# Patient Record
Sex: Male | Born: 1940 | Hispanic: No | Smoking: Never smoker
Health system: Southern US, Community
[De-identification: ages and names within clinical notes are randomized; demographics above are authoritative.]

## PROBLEM LIST (undated history)

## (undated) DIAGNOSIS — I714 Abdominal aortic aneurysm, without rupture, unspecified: Secondary | ICD-10-CM

## (undated) DIAGNOSIS — M51369 Other intervertebral disc degeneration, lumbar region without mention of lumbar back pain or lower extremity pain: Secondary | ICD-10-CM

## (undated) DIAGNOSIS — Z8719 Personal history of other diseases of the digestive system: Secondary | ICD-10-CM

## (undated) DIAGNOSIS — E119 Type 2 diabetes mellitus without complications: Secondary | ICD-10-CM

## (undated) DIAGNOSIS — G709 Myoneural disorder, unspecified: Secondary | ICD-10-CM

## (undated) DIAGNOSIS — N189 Chronic kidney disease, unspecified: Secondary | ICD-10-CM

## (undated) DIAGNOSIS — M5136 Other intervertebral disc degeneration, lumbar region: Secondary | ICD-10-CM

## (undated) DIAGNOSIS — I10 Essential (primary) hypertension: Secondary | ICD-10-CM

## (undated) HISTORY — DX: Abdominal aortic aneurysm, without rupture, unspecified: I71.40

## (undated) HISTORY — PX: SPINE SURGERY: SHX786

## (undated) HISTORY — DX: Other intervertebral disc degeneration, lumbar region without mention of lumbar back pain or lower extremity pain: M51.369

## (undated) HISTORY — DX: Type 2 diabetes mellitus without complications: E11.9

## (undated) HISTORY — DX: Other intervertebral disc degeneration, lumbar region: M51.36

## (undated) HISTORY — DX: Essential (primary) hypertension: I10

## (undated) HISTORY — DX: Abdominal aortic aneurysm, without rupture: I71.4

---

## 1998-11-30 ENCOUNTER — Ambulatory Visit (HOSPITAL_BASED_OUTPATIENT_CLINIC_OR_DEPARTMENT_OTHER): Admission: RE | Admit: 1998-11-30 | Discharge: 1998-11-30 | Payer: Self-pay | Admitting: Orthopedic Surgery

## 2014-05-09 ENCOUNTER — Telehealth: Payer: Self-pay | Admitting: Vascular Surgery

## 2014-05-09 NOTE — Telephone Encounter (Signed)
Received a referral request from Dr. Ann Held, [place] for 5.4 AAA.   I spoke with Mr. Andrew Estrada to arrange an appointment.   He refused to scheduled an appointment as he absolutely refuses to drive in [place] unless its an emergency.    I explained that he needs an appointment for his aneurysm, which he thought he was being referred for a hernia.    I offered an appointment for 12/22 with Dr. Donnetta Hutching (requested by the referring phys) patient also declined stating that was too close to Christmas and it just too busy that time of year.   I called Dr. Bary Leriche office and left a detailed voicemail for the triage nurse relaying the message stated above.

## 2014-05-11 ENCOUNTER — Encounter: Payer: Self-pay | Admitting: Vascular Surgery

## 2014-05-12 ENCOUNTER — Telehealth: Payer: Self-pay | Admitting: Vascular Surgery

## 2014-05-12 ENCOUNTER — Ambulatory Visit (INDEPENDENT_AMBULATORY_CARE_PROVIDER_SITE_OTHER): Payer: 59 | Admitting: Vascular Surgery

## 2014-05-12 ENCOUNTER — Encounter: Payer: Self-pay | Admitting: Vascular Surgery

## 2014-05-12 VITALS — BP 143/87 | HR 92 | Resp 16 | Ht 70.0 in | Wt 215.0 lb

## 2014-05-12 DIAGNOSIS — I714 Abdominal aortic aneurysm, without rupture, unspecified: Secondary | ICD-10-CM

## 2014-05-12 NOTE — Telephone Encounter (Signed)
Gave patient's wife the following information:  [facility] [phone] 05/18/14  3:15pm for [facility] 05/20/14 7:30 am Labs/CTA No npo 2 hrs. [phone] Return to see Dr. Donnetta Hutching on 06/14/14 per AVS. Wife verbalized understanding.

## 2014-05-12 NOTE — Progress Notes (Signed)
Patient name: Andrew Estrada MRN: [id number] DOB: 03/27/1941 Sex: male   Referred by: Nicki Reaper  Reason for referral:  Chief Complaint  Patient presents with  . New Evaluation    AAA referred by Dr Nicki Reaper    HISTORY OF PRESENT ILLNESS: Patient is a very pleasant 73 year old gentleman who had an incidental finding of 5.5 cm infrarenal abdominal aortic aneurysm on recent ultrasound. He had been having nonspecific abdominal discomfort and underwent ultrasound for further evaluation of this. Does have a long history of hiatal hernia. Also has a long history of severe venous disease. Did have an incidental finding of infrarenal aneurysm is here today for discussion of this. No symptoms related to his aneurysm. He denies any cardiac difficulties. Does have well-controlled non-insulin-dependent diabetes. He never smoked.  Past Medical History  Diagnosis Date  . Hypertension   . DDD (degenerative disc disease), lumbar   . Diabetes mellitus without complication     Diet controlled, no medications    Past Surgical History  Procedure Laterality Date  . Spine surgery      Laminectomy and failed disk syndrome     History   Social History  . Marital Status: Married    Spouse Name: N/A    Number of Children: N/A  . Years of Education: N/A   Occupational History  . Not on file.   Social History Main Topics  . Smoking status: Never Smoker   . Smokeless tobacco: Never Used  . Alcohol Use: No  . Drug Use: No  . Sexual Activity: Not on file   Other Topics Concern  . Not on file   Social History Narrative    Family History  Problem Relation Age of Onset  . Heart disease Mother   . Cancer Father     Allergies as of 05/12/2014 - Review Complete 05/12/2014  Allergen Reaction Noted  . Celebrex [celecoxib]  05/11/2014  . Ciprofloxacin Diarrhea 05/11/2014  . Codeine  05/11/2014    Current Outpatient Prescriptions on File Prior to Visit  Medication Sig Dispense Refill  .  cholecalciferol (VITAMIN D) 1000 UNITS tablet Take 1,000 Units by mouth daily.    . fluvastatin (LESCOL) 20 MG capsule Take 20 mg by mouth every evening.  6  . losartan (COZAAR) 25 MG tablet Take 25 mg by mouth daily.  12   No current facility-administered medications on file prior to visit.     REVIEW OF SYSTEMS:  Positives indicated with an "X"  CARDIOVASCULAR:  [ ]  chest pain   [x ] chest pressure   [ ]  palpitations   [ ]  orthopnea   [ ]  dyspnea on exertion   [ ]  claudication   [ ]  rest pain   [ ]  DVT   [ ]  phlebitis PULMONARY:   [ ]  productive cough   [ ]  asthma   [ ]  wheezing NEUROLOGIC:   [ ]  weakness  [ x] paresthesias  [ ]  aphasia  [ ]  amaurosis  [ ]  dizziness HEMATOLOGIC:   [ ]  bleeding problems   [ ]  clotting disorders MUSCULOSKELETAL:  [ ]  joint pain   [ ]  joint swelling GASTROINTESTINAL: [ ]   blood in stool  [ ]   hematemesis GENITOURINARY:  [x ]  dysuria  [ ]   hematuria PSYCHIATRIC:  [ ]  history of major depression INTEGUMENTARY:  [ ]  rashes  [ ]  ulcers CONSTITUTIONAL:  [ ]  fever   [ ]  chills  PHYSICAL EXAMINATION:  General: The patient is  a well-nourished male, in no acute distress. Vital signs are BP 143/87 mmHg  Pulse 92  Resp 16  Ht 5\' 10"  (1.778 m)  Wt 215 lb (97.523 kg)  BMI 30.85 kg/m2 Pulmonary: There is a good air exchange bilaterally without wheezing or rales. Abdomen: Soft and non-tender with normal pitch bowel sounds. Moderate obesity with no aneurysm palpable Musculoskeletal: There are no major deformities.  There is no significant extremity pain. Neurologic: No focal weakness or paresthesias are detected, Skin: There are no ulcer or rashes noted. Psychiatric: The patient has normal affect. Cardiovascular: There is a regular rate and rhythm without significant murmur appreciated. Pulse status 2+ radial 2+ femoral 2+ popliteal and 2+ dorsalis pedis pulses bilaterally Carotid arteries without bruits bilaterally    Impression and Plan:  I reviewed  his ultrasound with the patient. Had a very long discussion regarding his incidental finding of a 5.5 cm aneurysm. I explained the natural history and the risk of rupture and the very low chance for survival should rupture. I have recommended CT angiogram for further evaluation. I discussed both open and stent graft repair options for aneurysm. Also explained major risk from his aneurysm repair is actually cardiac issues. Although he is asymptomatic we will coordinate preoperative cardiac clearance through Dr. Bary Leriche office. We will also coordinate CT scan had [facility] for the patient's convenience. I will see him back in the office for further discussion following CT scan. I did explain symptoms of leaking aneurysm. The patient he knows to report ability should this occur. He understands this would be extremely unusual short-term.    Eliakim Tendler [facility] Office: [phone]

## 2014-05-20 ENCOUNTER — Telehealth: Payer: Self-pay

## 2014-05-20 NOTE — Telephone Encounter (Signed)
Reviewd pt's CTA Abd/ Pelvis with Dr. Bridgett Larsson , MD in office today.  Noted pt's AAA measuring 6 cm; and noted the finding of left kidney lesion.  Stated the f/u with Dr. Donnetta Hutching for further discussion of tx of AAA on 06/14/14 is okay.  Recommended to make PCP aware of the lesion on left kidney.  Called [facility]; spoke with nurse, Drue Dun.  Informed nurse that pt. had a CTA Abd/ Pelvis recently to further evaluate AAA, and it was noted that there is "a 1.1 cm. lesion on the anterior cortex (L) kidney, compatible with renal cell carcinoma."  Will fax report to Dr. Nicki Reaper @ [phone].

## 2014-06-07 ENCOUNTER — Encounter: Payer: Self-pay | Admitting: Vascular Surgery

## 2014-06-14 ENCOUNTER — Encounter: Payer: Self-pay | Admitting: Vascular Surgery

## 2014-06-14 ENCOUNTER — Ambulatory Visit (INDEPENDENT_AMBULATORY_CARE_PROVIDER_SITE_OTHER): Payer: 59 | Admitting: Vascular Surgery

## 2014-06-14 VITALS — BP 147/89 | HR 93 | Resp 16 | Ht 70.5 in | Wt 217.0 lb

## 2014-06-14 DIAGNOSIS — I714 Abdominal aortic aneurysm, without rupture, unspecified: Secondary | ICD-10-CM

## 2014-06-14 NOTE — Progress Notes (Signed)
    Established Abdominal Aortic Aneurysm  History of Present Illness  The patient is a 74 y.o. (1940/11/20) male who presents with chief complaint: follow up for AAA.  Previous ultrasound study demonstrated an AAA, measuring 5.5 cm.  The patient does not have back or abdominal pain.  The patient is not a smoker. He was last seen on 05/12/14 where he was recommended to have a CTA for further evaluation. He also has been evaluated by his cardiologist Dr. Bettina Gavia in [facility] for pre-operative risk stratification and optimization.   Physical Examination  Filed Vitals:   06/14/14 1414  BP: 147/89  Pulse: 93  Resp: 16  Height: 5' 10.5" (1.791 m)  Weight: 217 lb (98.431 kg)   Body mass index is 30.69 kg/(m^2).  General: A&O x 3, WD overweight male in NAD  Pulmonary: Non labored breathing  Vascular: 2+ dorsalis pedis pulses bilaterally   Medical Decision Making  The patient is a 74 y.o. male who presents with: asymptomatic AAA measuring 6 cm on CTA. Assured the patient that there are discrepancies between measurements on ultrasound versus CT and it is highly unlikely that his aneurysm grew 0.5 cm in the past few weeks. Upon reviewing his CTA results, the patient looks to be a good candidate for endovascular stent graft repair. Stent graft repair and open repair were discussed at length with the patient. The risks and benefits were discussed and the patient has decided to proceed with endovascular repair of his AAA. This will be scheduled at the patient's convenience. He has been cleared for surgery by his cardiologist Dr. Bettina Gavia, but we do not have access to the records at this time.    Virgina Jock, PA-C [facility] Office: [phone] Pager: [phone]  06/14/2014, 3:01 PM   This patient was seen in conjunction with Dr. Donnetta Hutching  I have examined the patient, reviewed and agree with above. Here today with his wife for continued discussion of his  infrarenal abdominal aortic aneurysm. He did have a CT scan had [facility] and I have the images for review. This shows maximal diameter 6 cm. He has a nice long infrarenal neck below the renal arteries prior to aneurysm formation and has acceptable iliac vessels for distal ceiling. Also has normal caliber femoral arteries for percutaneous access. Discussed option for open and stent graft repair and have recommended stent graft repair. Did explain lifelong follow-up required following stent graft repair. Splane the expected one day hospitalization. Patient understands and wished to proceed as soon as possible we will schedule this at his earliest convenience. He has had cardiac clearance.  EARLY, TODD, MD 06/14/2014 3:34 PM

## 2014-06-20 ENCOUNTER — Other Ambulatory Visit: Payer: Self-pay

## 2014-06-21 ENCOUNTER — Encounter: Payer: Self-pay | Admitting: Vascular Surgery

## 2014-06-24 ENCOUNTER — Encounter (HOSPITAL_COMMUNITY): Payer: Self-pay | Admitting: Pharmacy Technician

## 2014-06-27 ENCOUNTER — Encounter (HOSPITAL_COMMUNITY)
Admission: RE | Admit: 2014-06-27 | Discharge: 2014-06-27 | Disposition: A | Discharge status: Home / Self Care | Payer: Medicare Other | Source: Ambulatory Visit | Attending: Vascular Surgery | Admitting: Vascular Surgery

## 2014-06-27 ENCOUNTER — Other Ambulatory Visit: Payer: Self-pay

## 2014-06-27 ENCOUNTER — Encounter (HOSPITAL_COMMUNITY): Payer: Self-pay

## 2014-06-27 DIAGNOSIS — Z01812 Encounter for preprocedural laboratory examination: Secondary | ICD-10-CM | POA: Diagnosis not present

## 2014-06-27 DIAGNOSIS — Z01810 Encounter for preprocedural cardiovascular examination: Secondary | ICD-10-CM | POA: Insufficient documentation

## 2014-06-27 DIAGNOSIS — Z01818 Encounter for other preprocedural examination: Secondary | ICD-10-CM | POA: Insufficient documentation

## 2014-06-27 DIAGNOSIS — I714 Abdominal aortic aneurysm, without rupture: Secondary | ICD-10-CM | POA: Diagnosis not present

## 2014-06-27 HISTORY — DX: Myoneural disorder, unspecified: G70.9

## 2014-06-27 HISTORY — DX: Personal history of other diseases of the digestive system: Z87.19

## 2014-06-27 HISTORY — DX: Chronic kidney disease, unspecified: N18.9

## 2014-06-27 LAB — COMPREHENSIVE METABOLIC PANEL
ALT: 19 U/L (ref 0–53)
AST: 24 U/L (ref 0–37)
Albumin: 3.9 g/dL (ref 3.5–5.2)
Alkaline Phosphatase: 59 U/L (ref 39–117)
Anion gap: 13 (ref 5–15)
BUN: 12 mg/dL (ref 6–23)
CO2: 23 mmol/L (ref 19–32)
Calcium: 9.1 mg/dL (ref 8.4–10.5)
Chloride: 103 mEq/L (ref 96–112)
Creatinine, Ser: 0.99 mg/dL (ref 0.50–1.35)
GFR calc non Af Amer: 78 mL/min — ABNORMAL LOW (ref >90)
Glucose, Bld: 121 mg/dL — ABNORMAL HIGH (ref 70–99)
Potassium: 4.1 mmol/L (ref 3.5–5.1)
SODIUM: 139 mmol/L (ref 135–145)
Total Bilirubin: 0.4 mg/dL (ref 0.3–1.2)
Total Protein: 7.2 g/dL (ref 6.0–8.3)

## 2014-06-27 LAB — CBC
HCT: 41.3 % (ref 39.0–52.0)
HEMOGLOBIN: 14.1 g/dL (ref 13.0–17.0)
MCH: 31.4 pg (ref 26.0–34.0)
MCHC: 34.1 g/dL (ref 30.0–36.0)
MCV: 92 fL (ref 78.0–100.0)
PLATELETS: 197 10*3/uL (ref 150–400)
RBC: 4.49 MIL/uL (ref 4.22–5.81)
RDW: 13 % (ref 11.5–15.5)
WBC: 7.6 10*3/uL (ref 4.0–10.5)

## 2014-06-27 LAB — URINE MICROSCOPIC-ADD ON

## 2014-06-27 LAB — SURGICAL PCR SCREEN
MRSA, PCR: NEGATIVE
STAPHYLOCOCCUS AUREUS: POSITIVE — AB

## 2014-06-27 LAB — URINALYSIS, ROUTINE W REFLEX MICROSCOPIC
Bilirubin Urine: NEGATIVE
Glucose, UA: NEGATIVE mg/dL
KETONES UR: NEGATIVE mg/dL
Leukocytes, UA: NEGATIVE
Nitrite: NEGATIVE
PROTEIN: NEGATIVE mg/dL
Specific Gravity, Urine: 1.028 (ref 1.005–1.030)
UROBILINOGEN UA: 0.2 mg/dL (ref 0.0–1.0)
pH: 5 (ref 5.0–8.0)

## 2014-06-27 LAB — BLOOD GAS, ARTERIAL
ACID-BASE EXCESS: 2.1 mmol/L — AB (ref 0.0–2.0)
Bicarbonate: 26.4 mEq/L — ABNORMAL HIGH (ref 20.0–24.0)
Drawn by: 206361
FIO2: 0.21 %
O2 Saturation: 95.5 %
PATIENT TEMPERATURE: 98.6
PCO2 ART: 42.6 mmHg (ref 35.0–45.0)
TCO2: 27.7 mmol/L (ref 0–100)
pH, Arterial: 7.408 (ref 7.350–7.450)
pO2, Arterial: 74.9 mmHg — ABNORMAL LOW (ref 80.0–100.0)

## 2014-06-27 LAB — TYPE AND SCREEN
ABO/RH(D): O POS
Antibody Screen: NEGATIVE

## 2014-06-27 LAB — PROTIME-INR
INR: 0.95 (ref 0.00–1.49)
PROTHROMBIN TIME: 12.8 s (ref 11.6–15.2)

## 2014-06-27 LAB — APTT: aPTT: 32 seconds (ref 24–37)

## 2014-06-27 LAB — ABO/RH: ABO/RH(D): O POS

## 2014-06-27 NOTE — Pre-Procedure Instructions (Signed)
[facility]  06/27/2014   Your procedure is scheduled on: January 25th, Monday   Report to [facility] Admitting at 5:30 AM.   Call this number if you have problems the morning of surgery: [phone]   Remember:   Do not eat food or drink liquids after midnight Sunday.   Take these medicines the morning of surgery with A SIP OF WATER: None   Do not wear jewelry, no rings or watches.  Do not wear lotions or colognes.  You may NOT wear deodorant the day of surgery.   Men may shave face and neck.   Do not bring valuables to the hospital.  [facility] is not responsible for any belongings or valuables.               Contacts, dentures or bridgework may not be worn into surgery.  Leave suitcase in the car. After surgery it may be brought to your room.  For patients admitted to the hospital, discharge time is determined by your treatment team.    Name and phone number of your driver:    Special Instructions: "Preparing for Surgery" instruction sheet.   Please read over the following fact sheets that you were given: Pain Booklet, Coughing and Deep Breathing, Blood Transfusion Information, MRSA Information and Surgical Site Infection Prevention

## 2014-06-27 NOTE — Progress Notes (Addendum)
Patient has gone to see a Dr. Chrissie Noa [facility] in [place] ([place] for series of cardiac tests, which were done at [facility].  I have requested those tests today  [phone] He also has a 'spot' they just discovered from the CT Scan on his left kidney.  He states that he is unable to lay flat d/t the discomfort in the left flank.  His PCP is Dr. Ann Held @ [place].   DA

## 2014-06-27 NOTE — Progress Notes (Signed)
   06/27/14 1019  OBSTRUCTIVE SLEEP APNEA  Have you ever been diagnosed with sleep apnea through a sleep study? No  Do you snore loudly (loud enough to be heard through closed doors)?  1  Do you often feel tired, fatigued, or sleepy during the daytime? 1  Has anyone observed you stop breathing during your sleep? 0  Do you have, or are you being treated for high blood pressure? 1  BMI more than 35 kg/m2? 1  Age over 76 years old? 1  Neck circumference greater than 40 cm/16 inches? 1  Gender: 1  Obstructive Sleep Apnea Score 7  Score 4 or greater  Results sent to PCP

## 2014-06-28 NOTE — Progress Notes (Signed)
Anesthesia Chart Review:  Andrew Estrada is 74 year old male scheduled for abdominal aortic endovascular stent graft on 07/04/2014 with Dr. Donnetta Hutching.   PMH includes: HTN, DM, AAA, CKD. BMI 29.7  Preoperative labs reviewed.    EKG: NSR. Possible Left atrial enlargement. RBBB. Cannot rule out Inferior infarct , age undetermined. Anterior infarct , age undetermined. Compared to previous 05/18/2014, Q wave in lead III, otherwise appears stable.   Nuclear stress test 05/23/2014: 1. Functional capacity is not addressed 2. Normal resting LV size and function, with end-systolic volume of 63AG and EF of 68% 3. No perfusion defects, rest or vasodilator stress  Andrew Estrada has cardiac clearance from Dr. Danie Binder on chart at low/acceptable risk for AAA resection or endovascular repair.   If no changes, I anticipate Andrew Estrada can proceed with surgery as scheduled.   Willeen Cass, FNP-BC [facility]/Anesthesiology Phone: [phone] 06/28/2014 4:38 PM

## 2014-07-03 MED ORDER — DEXTROSE 5 % IV SOLN
1.5000 g | INTRAVENOUS | Status: AC
  Administered 2014-07-04: 1.5 g via INTRAVENOUS
  Filled 2014-07-03: qty 1.5

## 2014-07-04 ENCOUNTER — Inpatient Hospital Stay (HOSPITAL_COMMUNITY): Payer: Medicare Other | Admitting: Emergency Medicine

## 2014-07-04 ENCOUNTER — Encounter (HOSPITAL_COMMUNITY): Payer: Self-pay | Admitting: *Deleted

## 2014-07-04 ENCOUNTER — Encounter (HOSPITAL_COMMUNITY)
Admission: RE | Disposition: A | Discharge status: Home / Self Care | Payer: Self-pay | Source: Ambulatory Visit | Attending: Vascular Surgery

## 2014-07-04 ENCOUNTER — Inpatient Hospital Stay (HOSPITAL_COMMUNITY)
Admission: RE | Admit: 2014-07-04 | Discharge: 2014-07-05 | DRG: 269 | Disposition: A | Discharge status: Home / Self Care | Payer: Medicare Other | Source: Ambulatory Visit | Attending: Vascular Surgery | Admitting: Vascular Surgery

## 2014-07-04 ENCOUNTER — Inpatient Hospital Stay (HOSPITAL_COMMUNITY): Payer: Medicare Other | Admitting: Anesthesiology

## 2014-07-04 DIAGNOSIS — I714 Abdominal aortic aneurysm, without rupture, unspecified: Secondary | ICD-10-CM | POA: Diagnosis present

## 2014-07-04 DIAGNOSIS — Z6830 Body mass index (BMI) 30.0-30.9, adult: Secondary | ICD-10-CM | POA: Diagnosis not present

## 2014-07-04 DIAGNOSIS — I129 Hypertensive chronic kidney disease with stage 1 through stage 4 chronic kidney disease, or unspecified chronic kidney disease: Secondary | ICD-10-CM | POA: Diagnosis present

## 2014-07-04 DIAGNOSIS — E119 Type 2 diabetes mellitus without complications: Secondary | ICD-10-CM | POA: Diagnosis present

## 2014-07-04 DIAGNOSIS — E663 Overweight: Secondary | ICD-10-CM | POA: Diagnosis present

## 2014-07-04 DIAGNOSIS — N189 Chronic kidney disease, unspecified: Secondary | ICD-10-CM | POA: Diagnosis present

## 2014-07-04 HISTORY — PX: ABDOMINAL AORTIC ENDOVASCULAR STENT GRAFT: SHX5707

## 2014-07-04 LAB — GLUCOSE, CAPILLARY
GLUCOSE-CAPILLARY: 116 mg/dL — AB (ref 70–99)
Glucose-Capillary: 119 mg/dL — ABNORMAL HIGH (ref 70–99)

## 2014-07-04 LAB — CBC
HEMATOCRIT: 39.8 % (ref 39.0–52.0)
Hemoglobin: 13.8 g/dL (ref 13.0–17.0)
MCH: 32 pg (ref 26.0–34.0)
MCHC: 34.7 g/dL (ref 30.0–36.0)
MCV: 92.3 fL (ref 78.0–100.0)
Platelets: 188 10*3/uL (ref 150–400)
RBC: 4.31 MIL/uL (ref 4.22–5.81)
RDW: 12.8 % (ref 11.5–15.5)
WBC: 7.7 10*3/uL (ref 4.0–10.5)

## 2014-07-04 LAB — CREATININE, SERUM
Creatinine, Ser: 0.84 mg/dL (ref 0.50–1.35)
GFR, EST NON AFRICAN AMERICAN: 84 mL/min — AB (ref >90)

## 2014-07-04 SURGERY — INSERTION, ENDOVASCULAR STENT GRAFT, AORTA, ABDOMINAL
Anesthesia: General | Site: Abdomen

## 2014-07-04 MED ORDER — GLYCOPYRROLATE 0.2 MG/ML IJ SOLN
INTRAMUSCULAR | Status: AC
  Filled 2014-07-04: qty 2

## 2014-07-04 MED ORDER — LABETALOL HCL 5 MG/ML IV SOLN: 10.0000 mg | INTRAVENOUS | Status: DC | PRN

## 2014-07-04 MED ORDER — NEOSTIGMINE METHYLSULFATE 10 MG/10ML IV SOLN
INTRAVENOUS | Status: DC | PRN
  Administered 2014-07-04: 3 mg via INTRAVENOUS

## 2014-07-04 MED ORDER — LIDOCAINE HCL (CARDIAC) 20 MG/ML IV SOLN
INTRAVENOUS | Status: AC
  Filled 2014-07-04: qty 5

## 2014-07-04 MED ORDER — SODIUM CHLORIDE 0.9 % IV SOLN: 500.0000 mL | Freq: Once | INTRAVENOUS | Status: AC | PRN

## 2014-07-04 MED ORDER — METOPROLOL TARTRATE 1 MG/ML IV SOLN: 2.0000 mg | INTRAVENOUS | Status: DC | PRN

## 2014-07-04 MED ORDER — DEXTROSE 5 % IV SOLN
1.5000 g | Freq: Two times a day (BID) | INTRAVENOUS | Status: AC
  Administered 2014-07-04 – 2014-07-05 (×2): 1.5 g via INTRAVENOUS
  Filled 2014-07-04 (×2): qty 1.5

## 2014-07-04 MED ORDER — HEPARIN SODIUM (PORCINE) 1000 UNIT/ML IJ SOLN
INTRAMUSCULAR | Status: DC | PRN
  Administered 2014-07-04: 5000 [IU] via INTRAVENOUS

## 2014-07-04 MED ORDER — LIDOCAINE HCL (CARDIAC) 20 MG/ML IV SOLN
INTRAVENOUS | Status: DC | PRN
  Administered 2014-07-04: 80 mg via INTRAVENOUS

## 2014-07-04 MED ORDER — GUAIFENESIN-DM 100-10 MG/5ML PO SYRP: 15.0000 mL | ORAL_SOLUTION | ORAL | Status: DC | PRN

## 2014-07-04 MED ORDER — POTASSIUM CHLORIDE CRYS ER 20 MEQ PO TBCR: 20.0000 meq | EXTENDED_RELEASE_TABLET | Freq: Once | ORAL | Status: AC | PRN

## 2014-07-04 MED ORDER — ACETAMINOPHEN 500 MG PO TABS: 500.0000 mg | ORAL_TABLET | Freq: Four times a day (QID) | ORAL | Status: DC | PRN

## 2014-07-04 MED ORDER — PROMETHAZINE HCL 25 MG/ML IJ SOLN: 6.2500 mg | INTRAMUSCULAR | Status: DC | PRN

## 2014-07-04 MED ORDER — 0.9 % SODIUM CHLORIDE (POUR BTL) OPTIME
TOPICAL | Status: DC | PRN
  Administered 2014-07-04: 1000 mL

## 2014-07-04 MED ORDER — ENOXAPARIN SODIUM 40 MG/0.4ML ~~LOC~~ SOLN
40.0000 mg | SUBCUTANEOUS | Status: DC
  Administered 2014-07-04: 40 mg via SUBCUTANEOUS
  Filled 2014-07-04 (×2): qty 0.4

## 2014-07-04 MED ORDER — VITAMIN D3 25 MCG (1000 UNIT) PO TABS
3000.0000 [IU] | ORAL_TABLET | Freq: Every day | ORAL | Status: DC
  Administered 2014-07-04 – 2014-07-05 (×2): 3000 [IU] via ORAL
  Filled 2014-07-04 (×2): qty 3

## 2014-07-04 MED ORDER — TRAMADOL HCL 50 MG PO TABS: 50.0000 mg | ORAL_TABLET | Freq: Four times a day (QID) | ORAL | Status: DC | PRN

## 2014-07-04 MED ORDER — PHENYLEPHRINE HCL 10 MG/ML IJ SOLN
10.0000 mg | INTRAMUSCULAR | Status: DC | PRN
  Administered 2014-07-04: 40 ug/min via INTRAVENOUS

## 2014-07-04 MED ORDER — PROTAMINE SULFATE 10 MG/ML IV SOLN
INTRAVENOUS | Status: AC
  Filled 2014-07-04: qty 5

## 2014-07-04 MED ORDER — PANTOPRAZOLE SODIUM 40 MG PO TBEC
40.0000 mg | DELAYED_RELEASE_TABLET | Freq: Every day | ORAL | Status: DC
  Administered 2014-07-04 – 2014-07-05 (×2): 40 mg via ORAL
  Filled 2014-07-04 (×2): qty 1

## 2014-07-04 MED ORDER — ONDANSETRON HCL 4 MG/2ML IJ SOLN
INTRAMUSCULAR | Status: DC | PRN
Start: 2014-07-04 — End: 2014-07-04
  Administered 2014-07-04: 4 mg via INTRAVENOUS

## 2014-07-04 MED ORDER — FENTANYL CITRATE 0.05 MG/ML IJ SOLN
INTRAMUSCULAR | Status: DC | PRN
Start: 2014-07-04 — End: 2014-07-04
  Administered 2014-07-04: 100 ug via INTRAVENOUS

## 2014-07-04 MED ORDER — ROCURONIUM BROMIDE 50 MG/5ML IV SOLN
INTRAVENOUS | Status: AC
  Filled 2014-07-04: qty 1

## 2014-07-04 MED ORDER — MIDAZOLAM HCL 2 MG/2ML IJ SOLN
INTRAMUSCULAR | Status: AC
  Filled 2014-07-04: qty 2

## 2014-07-04 MED ORDER — PROTAMINE SULFATE 10 MG/ML IV SOLN
INTRAVENOUS | Status: DC | PRN
  Administered 2014-07-04: 50 mg via INTRAVENOUS

## 2014-07-04 MED ORDER — LOSARTAN POTASSIUM 25 MG PO TABS
25.0000 mg | ORAL_TABLET | Freq: Every day | ORAL | Status: DC
  Administered 2014-07-04 – 2014-07-05 (×2): 25 mg via ORAL
  Filled 2014-07-04 (×2): qty 1

## 2014-07-04 MED ORDER — NEOSTIGMINE METHYLSULFATE 10 MG/10ML IV SOLN
INTRAVENOUS | Status: AC
  Filled 2014-07-04: qty 1

## 2014-07-04 MED ORDER — ONDANSETRON HCL 4 MG/2ML IJ SOLN
INTRAMUSCULAR | Status: AC
  Filled 2014-07-04: qty 2

## 2014-07-04 MED ORDER — FENTANYL CITRATE 0.05 MG/ML IJ SOLN
INTRAMUSCULAR | Status: AC
  Filled 2014-07-04: qty 5

## 2014-07-04 MED ORDER — ROCURONIUM BROMIDE 100 MG/10ML IV SOLN
INTRAVENOUS | Status: DC | PRN
  Administered 2014-07-04: 50 mg via INTRAVENOUS

## 2014-07-04 MED ORDER — HYDROMORPHONE HCL 1 MG/ML IJ SOLN: 0.2500 mg | INTRAMUSCULAR | Status: DC | PRN

## 2014-07-04 MED ORDER — MORPHINE SULFATE 2 MG/ML IJ SOLN: 2.0000 mg | INTRAMUSCULAR | Status: DC | PRN

## 2014-07-04 MED ORDER — HYDRALAZINE HCL 20 MG/ML IJ SOLN: 5.0000 mg | INTRAMUSCULAR | Status: DC | PRN

## 2014-07-04 MED ORDER — SODIUM CHLORIDE 0.9 % IR SOLN
Status: DC | PRN
  Administered 2014-07-04: 500 mL

## 2014-07-04 MED ORDER — LACTATED RINGERS IV SOLN
INTRAVENOUS | Status: DC | PRN
  Administered 2014-07-04: 06:00:00 via INTRAVENOUS

## 2014-07-04 MED ORDER — KCL IN DEXTROSE-NACL 20-5-0.45 MEQ/L-%-% IV SOLN
INTRAVENOUS | Status: AC
  Filled 2014-07-04: qty 1000

## 2014-07-04 MED ORDER — IODIXANOL 320 MG/ML IV SOLN
INTRAVENOUS | Status: DC | PRN
  Administered 2014-07-04: 60.4 mL via INTRAVENOUS

## 2014-07-04 MED ORDER — DOCUSATE SODIUM 100 MG PO CAPS
100.0000 mg | ORAL_CAPSULE | Freq: Every day | ORAL | Status: DC
  Administered 2014-07-05: 100 mg via ORAL
  Filled 2014-07-04: qty 1

## 2014-07-04 MED ORDER — CHLORHEXIDINE GLUCONATE 4 % EX LIQD: 60.0000 mL | Freq: Once | CUTANEOUS | Status: DC

## 2014-07-04 MED ORDER — PROPOFOL 10 MG/ML IV BOLUS
INTRAVENOUS | Status: AC
Start: 2014-07-04 — End: 2014-07-04
  Filled 2014-07-04: qty 20

## 2014-07-04 MED ORDER — ONDANSETRON HCL 4 MG/2ML IJ SOLN: 4.0000 mg | Freq: Four times a day (QID) | INTRAMUSCULAR | Status: DC | PRN

## 2014-07-04 MED ORDER — MIDAZOLAM HCL 5 MG/5ML IJ SOLN
INTRAMUSCULAR | Status: DC | PRN
  Administered 2014-07-04: 1 mg via INTRAVENOUS

## 2014-07-04 MED ORDER — PHENOL 1.4 % MT LIQD: 1.0000 | OROMUCOSAL | Status: DC | PRN

## 2014-07-04 MED ORDER — SODIUM CHLORIDE 0.9 % IV SOLN: INTRAVENOUS | Status: DC

## 2014-07-04 MED ORDER — PROPOFOL 10 MG/ML IV BOLUS
INTRAVENOUS | Status: DC | PRN
  Administered 2014-07-04: 120 mg via INTRAVENOUS

## 2014-07-04 MED ORDER — HEPARIN SODIUM (PORCINE) 1000 UNIT/ML IJ SOLN
INTRAMUSCULAR | Status: AC
  Filled 2014-07-04: qty 2

## 2014-07-04 MED ORDER — GLYCOPYRROLATE 0.2 MG/ML IJ SOLN
INTRAMUSCULAR | Status: DC | PRN
  Administered 2014-07-04: 0.4 mg via INTRAVENOUS

## 2014-07-04 MED ORDER — DEXAMETHASONE SODIUM PHOSPHATE 4 MG/ML IJ SOLN
INTRAMUSCULAR | Status: AC
  Filled 2014-07-04: qty 1

## 2014-07-04 MED ORDER — ALUM & MAG HYDROXIDE-SIMETH 200-200-20 MG/5ML PO SUSP: 15.0000 mL | ORAL | Status: DC | PRN

## 2014-07-04 MED ORDER — LACTATED RINGERS IV SOLN
INTRAVENOUS | Status: DC
  Administered 2014-07-04: 07:00:00 via INTRAVENOUS

## 2014-07-04 MED ORDER — ACETAMINOPHEN 325 MG PO TABS: 325.0000 mg | ORAL_TABLET | ORAL | Status: DC | PRN

## 2014-07-04 MED ORDER — KCL IN DEXTROSE-NACL 20-5-0.45 MEQ/L-%-% IV SOLN
INTRAVENOUS | Status: DC
  Administered 2014-07-04: 15:00:00 via INTRAVENOUS
  Filled 2014-07-04 (×3): qty 1000

## 2014-07-04 MED ORDER — ACETAMINOPHEN 650 MG RE SUPP: 325.0000 mg | RECTAL | Status: DC | PRN

## 2014-07-04 SURGICAL SUPPLY — 76 items
APL SKNCLS STERI-STRIP NONHPOA (GAUZE/BANDAGES/DRESSINGS) ×1
BAG BANDED W/RUBBER/TAPE 36X54 (MISCELLANEOUS) ×3 IMPLANT
BAG EQP BAND 135X91 W/RBR TAPE (MISCELLANEOUS) ×1
BAG SNAP BAND KOVER 36X36 (MISCELLANEOUS) ×3 IMPLANT
BENZOIN TINCTURE PRP APPL 2/3 (GAUZE/BANDAGES/DRESSINGS) ×3 IMPLANT
CANISTER SUCTION 2500CC (MISCELLANEOUS) ×3 IMPLANT
CATH BEACON 5.038 65CM KMP-01 (CATHETERS) ×2 IMPLANT
CATH OMNI FLUSH .035X70CM (CATHETERS) ×2 IMPLANT
CLIP LIGATING EXTRA MED SLVR (CLIP) IMPLANT
CLIP LIGATING EXTRA SM BLUE (MISCELLANEOUS) IMPLANT
CLOSURE WOUND 1/2 X4 (GAUZE/BANDAGES/DRESSINGS) ×1
COVER DOME SNAP 22 D (MISCELLANEOUS) ×3 IMPLANT
COVER MAYO STAND STRL (DRAPES) ×3 IMPLANT
COVER PROBE W GEL 5X96 (DRAPES) ×3 IMPLANT
COVER SURGICAL LIGHT HANDLE (MISCELLANEOUS) ×3 IMPLANT
DEVICE CLOSURE PERCLS PRGLD 6F (VASCULAR PRODUCTS) IMPLANT
DRAPE TABLE COVER HEAVY DUTY (DRAPES) ×3 IMPLANT
DRSG TEGADERM 2-3/8X2-3/4 SM (GAUZE/BANDAGES/DRESSINGS) ×6 IMPLANT
DRSG TEGADERM 4X4.75 (GAUZE/BANDAGES/DRESSINGS) ×2 IMPLANT
DRYSEAL FLEXSHEATH 12FR 33CM (SHEATH) ×2
DRYSEAL FLEXSHEATH 18FR 33CM (SHEATH) ×2
ELECT CAUTERY BLADE 6.4 (BLADE) IMPLANT
ELECT REM PT RETURN 9FT ADLT (ELECTROSURGICAL) ×6
ELECTRODE REM PT RTRN 9FT ADLT (ELECTROSURGICAL) ×2 IMPLANT
EXCLUDER TNK LEG 31MX14X17 (Endovascular Graft) IMPLANT
EXCLUDER TRUNK LEG 31MX14X17 (Endovascular Graft) ×3 IMPLANT
GAUZE SPONGE 2X2 8PLY STRL LF (GAUZE/BANDAGES/DRESSINGS) ×1 IMPLANT
GLOVE BIO SURGEON STRL SZ 6.5 (GLOVE) ×1 IMPLANT
GLOVE BIO SURGEONS STRL SZ 6.5 (GLOVE) ×1
GLOVE BIOGEL PI IND STRL 6.5 (GLOVE) IMPLANT
GLOVE BIOGEL PI INDICATOR 6.5 (GLOVE) ×4
GLOVE SS BIOGEL STRL SZ 7.5 (GLOVE) ×1 IMPLANT
GLOVE SUPERSENSE BIOGEL SZ 7.5 (GLOVE) ×2
GOWN STRL REUS W/ TWL LRG LVL3 (GOWN DISPOSABLE) ×3 IMPLANT
GOWN STRL REUS W/TWL LRG LVL3 (GOWN DISPOSABLE) ×9
GRAFT BALLN CATH 65CM (STENTS) IMPLANT
GRAFT EXCLUDER LEG 12X12 (Endovascular Graft) ×2 IMPLANT
INSERT FOGARTY 61MM (MISCELLANEOUS) IMPLANT
INSERT FOGARTY SM (MISCELLANEOUS) IMPLANT
KIT BASIN OR (CUSTOM PROCEDURE TRAY) ×3 IMPLANT
KIT ROOM TURNOVER OR (KITS) ×3 IMPLANT
NDL PERC 18GX7CM (NEEDLE) ×1 IMPLANT
NEEDLE PERC 18GX7CM (NEEDLE) ×3 IMPLANT
NS IRRIG 1000ML POUR BTL (IV SOLUTION) ×3 IMPLANT
PACK AORTA (CUSTOM PROCEDURE TRAY) ×3 IMPLANT
PAD ARMBOARD 7.5X6 YLW CONV (MISCELLANEOUS) ×6 IMPLANT
PENCIL BUTTON HOLSTER BLD 10FT (ELECTRODE) IMPLANT
PERCLOSE PROGLIDE 6F (VASCULAR PRODUCTS) ×18
PROBE PENCIL 8 MHZ STRL DISP (MISCELLANEOUS) IMPLANT
PROTECTION STATION PRESSURIZED (MISCELLANEOUS) ×3
SHEATH AVANTI 11CM 8FR (MISCELLANEOUS) ×2 IMPLANT
SHEATH BRITE TIP 8FR 23CM (MISCELLANEOUS) ×2 IMPLANT
SHEATH DRYSEAL FLEX 12FR 33CM (SHEATH) IMPLANT
SHEATH DRYSEAL FLEX 18FR 33CM (SHEATH) IMPLANT
SPONGE GAUZE 2X2 STER 10/PKG (GAUZE/BANDAGES/DRESSINGS) ×2
STAPLER VISISTAT 35W (STAPLE) IMPLANT
STATION PROTECTION PRESSURIZED (MISCELLANEOUS) ×1 IMPLANT
STENT GRAFT BALLN CATH 65CM (STENTS) ×2
STOPCOCK MORSE 400PSI 3WAY (MISCELLANEOUS) ×3 IMPLANT
STRIP CLOSURE SKIN 1/2X4 (GAUZE/BANDAGES/DRESSINGS) ×2 IMPLANT
SUT ETHILON 3 0 PS 1 (SUTURE) IMPLANT
SUT PROLENE 5 0 C 1 24 (SUTURE) IMPLANT
SUT VIC AB 2-0 CTX 36 (SUTURE) IMPLANT
SUT VIC AB 3-0 SH 18 (SUTURE) IMPLANT
SUT VIC AB 3-0 SH 27 (SUTURE)
SUT VIC AB 3-0 SH 27X BRD (SUTURE) IMPLANT
SUT VICRYL 4-0 PS2 18IN ABS (SUTURE) ×6 IMPLANT
SYR 20CC LL (SYRINGE) ×6 IMPLANT
SYR 30ML LL (SYRINGE) IMPLANT
SYR 5ML LL (SYRINGE) ×3 IMPLANT
SYR MEDRAD MARK V 150ML (SYRINGE) ×3 IMPLANT
SYRINGE 10CC LL (SYRINGE) ×9 IMPLANT
TRAY FOLEY CATH 16FRSI W/METER (SET/KITS/TRAYS/PACK) ×3 IMPLANT
TUBING HIGH PRESSURE 120CM (CONNECTOR) ×3 IMPLANT
WIRE AMPLATZ SS-J .035X180CM (WIRE) ×4 IMPLANT
WIRE BENTSON .035X145CM (WIRE) ×4 IMPLANT

## 2014-07-04 NOTE — Interval H&P Note (Signed)
History and Physical Interval Note:  07/04/2014 7:00 AM  Andrew Estrada  has presented today for surgery, with the diagnosis of AAA  I71.4  The various methods of treatment have been discussed with the patient and family. After consideration of risks, benefits and other options for treatment, the patient has consented to  Procedure(s): ABDOMINAL AORTIC ENDOVASCULAR STENT GRAFT-GORE (N/A) as a surgical intervention .  The patient's history has been reviewed, patient examined, no change in status, stable for surgery.  I have reviewed the patient's chart and labs.  Questions were answered to the patient's satisfaction.     Gedalya Jim

## 2014-07-04 NOTE — H&P (View-Only) (Signed)
    Established Abdominal Aortic Aneurysm  History of Present Illness  The patient is a 74 y.o. (1941-03-10) male who presents with chief complaint: follow up for AAA.  Previous ultrasound study demonstrated an AAA, measuring 5.5 cm.  The patient does not have back or abdominal pain.  The patient is not a smoker. He was last seen on 05/12/14 where he was recommended to have a CTA for further evaluation. He also has been evaluated by his cardiologist Dr. Bettina Gavia in [facility] for pre-operative risk stratification and optimization.   Physical Examination  Filed Vitals:   06/14/14 1414  BP: 147/89  Pulse: 93  Resp: 16  Height: 5' 10.5" (1.791 m)  Weight: 217 lb (98.431 kg)   Body mass index is 30.69 kg/(m^2).  General: A&O x 3, WD overweight male in NAD  Pulmonary: Non labored breathing  Vascular: 2+ dorsalis pedis pulses bilaterally   Medical Decision Making  The patient is a 74 y.o. male who presents with: asymptomatic AAA measuring 6 cm on CTA. Assured the patient that there are discrepancies between measurements on ultrasound versus CT and it is highly unlikely that his aneurysm grew 0.5 cm in the past few weeks. Upon reviewing his CTA results, the patient looks to be a good candidate for endovascular stent graft repair. Stent graft repair and open repair were discussed at length with the patient. The risks and benefits were discussed and the patient has decided to proceed with endovascular repair of his AAA. This will be scheduled at the patient's convenience. He has been cleared for surgery by his cardiologist Dr. Bettina Gavia, but we do not have access to the records at this time.    Virgina Jock, PA-C [facility] [place] Office: [phone] Pager: [phone]  06/14/2014, 3:01 PM   This patient was seen in conjunction with Dr. Donnetta Hutching  I have examined the patient, reviewed and agree with above. Here today with his wife for continued discussion of his  infrarenal abdominal aortic aneurysm. He did have a CT scan had [facility] and I have the images for review. This shows maximal diameter 6 cm. He has a nice long infrarenal neck below the renal arteries prior to aneurysm formation and has acceptable iliac vessels for distal ceiling. Also has normal caliber femoral arteries for percutaneous access. Discussed option for open and stent graft repair and have recommended stent graft repair. Did explain lifelong follow-up required following stent graft repair. Splane the expected one day hospitalization. Patient understands and wished to proceed as soon as possible we will schedule this at his earliest convenience. He has had cardiac clearance.  Emarion Toral, MD 06/14/2014 3:34 PM

## 2014-07-04 NOTE — Anesthesia Postprocedure Evaluation (Signed)
  Anesthesia Post-op Note  Patient: Andrew Estrada  Procedure(s) Performed: Procedure(s) (LRB): ABDOMINAL AORTIC ENDOVASCULAR STENT GRAFT-GORE (N/A)  Patient Location: PACU  Anesthesia Type: General  Level of Consciousness: awake and alert   Airway and Oxygen Therapy: Patient Spontanous Breathing  Post-op Pain: mild  Post-op Assessment: Post-op Vital signs reviewed, Patient's Cardiovascular Status Stable, Respiratory Function Stable, Patent Airway and No signs of Nausea or vomiting  Last Vitals:  Filed Vitals:   07/04/14 0939  BP: 144/72  Pulse: 86  Temp: 36.9 C  Resp: 13    Post-op Vital Signs: stable   Complications: No apparent anesthesia complications

## 2014-07-04 NOTE — Op Note (Signed)
OPERATIVE NOTE    PROCEDURE:  1. Bilateral common femoral artery cannulation under ultrasound guidance 2. "Preclose" repair of bilateral common femoral artery  3. Placement of catheter in aorta x 2 4. Aortogram 5. Repair of aorta with modular bifurcated prosthesis with one limb (31 mm x 14 mm x 17 cm) 6. Placement of left iliac limb (12 mm x 12 cm) 7. Radiology S&I  PRE-OPERATIVE DIAGNOSIS: large abdominal aortic aneurysm   POST-OPERATIVE DIAGNOSIS: same as above   CO-SURGEONS: Curt Jews, MD; Adele Barthel, MD   ANESTHESIA: general   ESTIMATED BLOOD LOSS: 100 cc   FINDING(S):  1. No endoleak 2. Successful exclusion of abdominal aortic aneurysm with continued perfusion to bilateral renal arteries and bilateral internal iliac arteries  INDICATIONS:  Andrew Estrada is a 74 y.o. male  who presents with large aortic aneurysm.  The patient was scheduled for endovascular aortic repair. The patient is aware the risks of endovascular aortic surgery include but are not limited to: bleeding, need for transfusion, infection, death, stroke, paralysis, wound complications, impotence, bowel ischemia, extended ventilation and need for secondary procedures. Overall, Dr. Donnetta Hutching cited a EVAR mortality rate of 1-2% and morbidity rate of 15%.   DESCRIPTION:  After full informed written consent was obtained from the patient, he was brought back to the operating room, amd placed supine upon the operating table. He already had a A-line place. After obtaining adequate anesthesia, a Foley catheter was placed to monitor urine output in the operating room. He was prepped and draped in the standard fashion for either open aneurysm repair or endovascular aneurysm repair.   Dr. Donnetta Hutching first turned his attention to the right groin. Under ultrasound guidance, he identified the common femoral artery and made a stab incision over it. He dissected down to the artery in the groin with a hemostat and then  cannulated it with a 18-gauge needle. I then passed a Benson wire up into the iliac system. He dilated the skin tract with a 8-Fr dilator. The first Proglide was loaded over the Beggs wire and deployed and rotated medially. The Proglide sutures were removed and tagged and the [facility] wire replaced in the Proglide device. The Proglide was exchanged for a new ProGlide, which was deployed and rotated laterally. The Proglide sutures were removed and then the Proglide was exchanged for a regular size 8-Fr sheath. I such fashion Dr. Donnetta Hutching completed the "Pre-close technique" on the right side and then he replaced the [facility] wire up into the aorta.   At this point, I turned my attention to the left groin. In a similar fashion, I completed the "Pre-close" technique on the left common femoral artery. I placed a long 8-Fr sheath on this side. With some difficulty, I eventually was able to get into the suprarenal aorta with a KMP catheter and Benson wire. I exchanged the wire for an Amplatz wire and then exchanged the left sheath for a 12-Fr Dryseal shath. The dilator was removed.   I loaded the pigtail catheter over the wire and advanced it to L2 for a suprarenal aortogram.  The wire was removed and the catheter declotted and deairred while attached to the power injector circuit.  On the right side, Dr. Donnetta Hutching used a Britta Mccreedy wire to get into the supra renal aorta. The wire was exchanged for an Amplatz wire with the KMP catheter. The right sheath was exchanged for a 18-Fr Dryseal sheath. The dilator was removed.  The patient was given a therapeutic dose of  Heparin. Dr. Donnetta Hutching deliveryed the main body device, 31 mm x 14 mm x 17 cm to L2. A power injector aortogram was performed to demonstate the level of the renal arteries. He deployed the main body below the level of the left renal artery, which was the lowest renal artery. A power injector aortogram was completed, which demonstrated deployment of the main body exactly  at the level of the left renal artery.  At this point, I placed the Amplatz wire through the pigtail catheter and removed it.  I loaded a Benson wire through the left sheath with the help of the 8 Fr dilator.  I then loaded a KMP catheter over this wire. Using this combination, I was able to cannulate the contralateral gate. I advanced both the catheter and wire through the graft. I pulled out the Amplatz wire and then exchanged the [facility] wire for the Amplatz wire.  The catheter was exchanged for a pigtail catheter and was then pulled down into the graft. I was able to rotate the pigtail, verifying intragraft position. I then pulled the He then pulled the catheter down to the flow divider. The left sheath was pulled until distal to the left internal iliac artery.  A hand injection verified the position of the right internal iliac artery. Based on the images, the plan was to place a 12 mm x 12 cm iliac limb. The Amplatz wire was replaced. I loaded the iliac limb over the left Amplatz wire into the main body via the contralateral gate. The limb was deployed with adequate overlap. The stent delivery system was released and removed.   At this point, Dr. Donnetta Hutching fully deployed the main body, fully extending the partially constrained limb and also releasing the device from the delivery system. The stent delivery system was then removed. I then obtained a Q50 molding balloon which he used to mold the top of the graft and entire contralateral iliac limb. The balloon was removed. Dr. Donnetta Hutching placed the balloon over the right wire and molded the overlap points and the distal ipsilateral iliac limb. The balloon was deflated and removed. The pigtail catheter was placed in the suprarenal position over the left wire and the wire removed. The catheter was connected to the power injector circuit. Completion aortogram demonstrated no endoleak, complete exclusion of abdominal aortic aneurysm, and continue perfusion of both  renal arteries and both internal iliac arteries.  At this point, Dr. Donnetta Hutching placed the [facility] in the pigtail catheter and removed the catheter. The right sheath was removed and pressure held. The right common femoral artery was repaired by cinching down the ProGlide sutures in two stages. In the first stage, the sutures were tightened with the wire left in place after removing the sheath. There was minimal blood loss after the first stage, with the wire in place. The wire was then removed from the right groin and the sutures re-cinched and the white tightening stitches were pulled to fully deploy the Proglide devices. All four sutures were held in place with a hemostat with tension on the skin.   In a similar fashion, I repaired the left common femoral artery. The patient was given Protamine to fully reverse anticoagulation. After waiting a few minutes, there was no further significant bleeding from either groin cannulation site so the sutures were transected with suture transection devices. Each groin was repaired with a U stitch of 4-0 Vicryl. Both feet appeared well perfused.   SPECIMEN(S): none   COMPLICATIONS: none  CONDITION: stable    Adele Barthel, MD [facility] [place] Office: [phone] Pager: [phone]  07/04/2014, 12:18 PM

## 2014-07-04 NOTE — Anesthesia Procedure Notes (Signed)
Procedure Name: Intubation Date/Time: 07/04/2014 7:44 AM Performed by: Melina Copa, Ellery Meroney R Pre-anesthesia Checklist: Patient identified, Emergency Drugs available, Suction available, Patient being monitored and Timeout performed Patient Re-evaluated:Patient Re-evaluated prior to inductionOxygen Delivery Method: Circle system utilized Preoxygenation: Pre-oxygenation with 100% oxygen Intubation Type: IV induction Ventilation: Mask ventilation without difficulty Laryngoscope Size: Mac and 4 Grade View: Grade II Tube type: Oral Tube size: 8.0 mm Airway Equipment and Method: Stylet Placement Confirmation: ETT inserted through vocal cords under direct vision,  positive ETCO2 and breath sounds checked- equal and bilateral Secured at: 23 cm Tube secured with: Tape Dental Injury: Teeth and Oropharynx as per pre-operative assessment

## 2014-07-04 NOTE — Anesthesia Preprocedure Evaluation (Addendum)
Anesthesia Evaluation  Patient identified by MRN, date of birth, ID band Patient awake    Reviewed: Allergy & Precautions, NPO status , Patient's Chart, lab work & pertinent test results  Airway Mallampati: II  TM Distance: >3 FB Neck ROM: Full    Dental no notable dental hx.    Pulmonary neg pulmonary ROS,  breath sounds clear to auscultation  Pulmonary exam normal       Cardiovascular hypertension, Pt. on medications + Peripheral Vascular Disease Rhythm:Regular Rate:Normal     Neuro/Psych negative neurological ROS  negative psych ROS   GI/Hepatic negative GI ROS, Neg liver ROS,   Endo/Other  diabetes  Renal/GU negative Renal ROS  negative genitourinary   Musculoskeletal negative musculoskeletal ROS (+)   Abdominal   Peds negative pediatric ROS (+)  Hematology negative hematology ROS (+)   Anesthesia Other Findings   Reproductive/Obstetrics negative OB ROS                             Anesthesia Physical Anesthesia Plan  ASA: III  Anesthesia Plan: General   Post-op Pain Management:    Induction: Intravenous  Airway Management Planned: Oral ETT  Additional Equipment: Arterial line  Intra-op Plan:   Post-operative Plan: Extubation in OR  Informed Consent: I have reviewed the patients History and Physical, chart, labs and discussed the procedure including the risks, benefits and alternatives for the proposed anesthesia with the patient or authorized representative who has indicated his/her understanding and acceptance.   Dental advisory given  Plan Discussed with: CRNA and Surgeon  Anesthesia Plan Comments:        Anesthesia Quick Evaluation

## 2014-07-04 NOTE — Op Note (Signed)
OPERATIVE REPORT  DATE OF SURGERY: 07/04/2014  PATIENT: Andrew Estrada, 74 y.o. male MRN: [id number]  DOB: 27-Jun-1940  PRE-OPERATIVE DIAGNOSIS: Abdominal aortic aneurysm   POST-OPERATIVE DIAGNOSIS:  Same  PROCEDURE: Simeon Craft excluder stent graft repair of abdominal aortic aneurysm  SURGEON:  Curt Jews, M.D.  PHYSICIAN ASSISTANT: Dr. Adele Barthel  ANESTHESIA:  Gen.  EBL: Less than 100 ml  Total I/O In: 900 [I.V.:900] Out: 125 [Urine:125]  BLOOD ADMINISTERED: None  DRAINS: None  SPECIMEN: None  COUNTS CORRECT:  YES  PLAN OF CARE: PACU stable   PATIENT DISPOSITION:  PACU - hemodynamically stable  PROCEDURE DETAILS: The patient was taken to the operating placed supine position where the area of abdomen and both groins were prepped and draped in usual sterile fashion. Using SonoSite ultrasound for visualization the right and left common femoral arteries were accessed with 18-gauge needle and a guidewire was passed centrally. This was confirmed with fluoroscopy. Using the double Perclose device technique a Perclose device was placed at 2:00 and 10:00 around the guidewire and partially deployed. An 8 French sheath was passed over the right femoral guidewire and on the left a long 8 sheath catheter was placed. On the left a KP catheter was used exchange the Bentson wire for an Amplatz superstiff wire. The patient was given 5000 units intravenous heparin. A 12 French sheath was passed through the left common femoral artery up to the level of the aorta and this was confirmed with fluoroscopy. Using a buddy wire technique the Bentson wire was placed alongside the Amplatz superstiff wire and a pigtail catheter was positioned the level of the renal arteries. Next the 8 French sheath in the right groin was exchanged for a long 18 [place] sheath. This was done after exchanging the Bentson wire for an Amplatz superstiff wire. The 50 French sheath was placed with the tip in the aorta. The main  body device was placed through the right femoral sheath and a reverse limb technique. This was a 31 x 14 x 17 cm device. This was positioned at the level the renal arteries. A pigtail injection through the left side revealed the level of the right and left renal arteries. The left renal artery was slightly lower. The main body was positioned at this level and the main body was deployed. Repeat injection to the pigtail catheter revealed excellent positioning of the device just below the takeoff of the left renal artery. The pigtail catheter was then brought back down into the aortic sac over a Benson wire. The KP catheter was used to cannulate the contralateral gate and this was confirmed by twirling a pigtail catheter in the main body. A retrograde injection through the left femoral sheath revealed the takeoff of the left hypogastric artery. The contralateral limb was chosen as a 12 x 12 cm contralateral limb. This was positioned to overlap the appropriate amount of the main body was deployed. Finally a retrograde injection to the right sheath revealed the takeoff of the hypogastric artery. The main body was appropriately sized so that it felt just above this. The ipsilateral limb was deployed. V/Q 15 balloon was then placed over the guidewire on the left to dilate the proximal attachment site and also the junction and the distal attachment on the left. The 250 balloon was then placed over the right guidewire to dilate gently the right attachment site. Pigtail catheter was then positioned through the left groin up to the level above the renal arteries and a final  injection was undertaken. This showed excellent apposition proximally and distally with no evidence of endoleak. The Bentson guidewires were replaced bilaterally and the sheaths were removed. First the right and in the left femoral sheath. Pressure was held for hemostasis and the prior positioned Perclose devices were secured giving excellent hemostasis.  The patient was given 50 mg of protamine to reverse the heparin. The groin punctures were closed with 4-0 subcuticular Vicryl bilaterally. Patient continues to have palpable posterior tibial pulses bilaterally and was transferred to the recovery room in stable condition   Curt Jews, M.D. 07/04/2014 9:41 AM

## 2014-07-04 NOTE — Progress Notes (Signed)
Utilization review completed. Danaisha Celli, RN, BSN. 

## 2014-07-04 NOTE — Transfer of Care (Signed)
Immediate Anesthesia Transfer of Care Note  Patient: Andrew Estrada  Procedure(s) Performed: Procedure(s): ABDOMINAL AORTIC ENDOVASCULAR STENT GRAFT-GORE (N/A)  Patient Location: PACU  Anesthesia Type:General  Level of Consciousness: awake  Airway & Oxygen Therapy: Patient Spontanous Breathing and Patient connected to nasal cannula oxygen  Post-op Assessment: Report given to PACU RN, Post -op Vital signs reviewed and stable and Patient moving all extremities  Post vital signs: Reviewed and stable  Complications: No apparent anesthesia complications

## 2014-07-05 ENCOUNTER — Other Ambulatory Visit: Payer: Self-pay | Admitting: *Deleted

## 2014-07-05 ENCOUNTER — Encounter (HOSPITAL_COMMUNITY): Payer: Self-pay | Admitting: Vascular Surgery

## 2014-07-05 DIAGNOSIS — I714 Abdominal aortic aneurysm, without rupture, unspecified: Secondary | ICD-10-CM

## 2014-07-05 DIAGNOSIS — Z48812 Encounter for surgical aftercare following surgery on the circulatory system: Secondary | ICD-10-CM

## 2014-07-05 LAB — CBC
HCT: 37.6 % — ABNORMAL LOW (ref 39.0–52.0)
HEMOGLOBIN: 12.8 g/dL — AB (ref 13.0–17.0)
MCH: 31.4 pg (ref 26.0–34.0)
MCHC: 34 g/dL (ref 30.0–36.0)
MCV: 92.2 fL (ref 78.0–100.0)
Platelets: 181 10*3/uL (ref 150–400)
RBC: 4.08 MIL/uL — ABNORMAL LOW (ref 4.22–5.81)
RDW: 12.9 % (ref 11.5–15.5)
WBC: 10.3 10*3/uL (ref 4.0–10.5)

## 2014-07-05 LAB — BASIC METABOLIC PANEL
ANION GAP: 10 (ref 5–15)
BUN: 12 mg/dL (ref 6–23)
CALCIUM: 8.8 mg/dL (ref 8.4–10.5)
CHLORIDE: 98 mmol/L (ref 96–112)
CO2: 29 mmol/L (ref 19–32)
Creatinine, Ser: 1.01 mg/dL (ref 0.50–1.35)
GFR calc non Af Amer: 71 mL/min — ABNORMAL LOW (ref >90)
GFR, EST AFRICAN AMERICAN: 82 mL/min — AB (ref >90)
GLUCOSE: 137 mg/dL — AB (ref 70–99)
POTASSIUM: 3.8 mmol/L (ref 3.5–5.1)
Sodium: 137 mmol/L (ref 135–145)

## 2014-07-05 MED ORDER — TRAMADOL HCL 50 MG PO TABS: 50.0000 mg | ORAL_TABLET | Freq: Four times a day (QID) | ORAL | Status: AC | PRN

## 2014-07-05 NOTE — Progress Notes (Signed)
Discussed AVS and prescription with pt and significant other. Both verbalize understanding and have no questions. Personal belongings and prescription with pt. Notified Elink and CCMD of discharge, monitor and IVs removed. Pt discharged to home with girlfriend.

## 2014-07-05 NOTE — Progress Notes (Addendum)
   [facility] [place]  Subjective  - Doing well.  Not much pain. He needs to void and walk before going home today.   Objective 138/68 73 97.7 F (36.5 C) (Oral) 16 96%  Intake/Output Summary (Last 24 hours) at 07/05/14 0730 Last data filed at 07/05/14 9528  Gross per 24 hour  Intake   1810 ml  Output   2125 ml  Net   -315 ml    Distal DP/PT pulses intact 2+ Groins soft without hematoma Abdomin soft Heart NSR  Assessment/Planning: POD #1 EVAR  He is tolerating PO's wel sitting up in bedside chair. He will ambulate with the nurses later and he needs to void independently. Discharge home later today.   F/U in 4 weeks with CTA  Laurence Slate [facility] 07/05/2014 7:30 AM --  Laboratory Lab Results:  Recent Labs  07/04/14 1640 07/05/14 0305  WBC 7.7 10.3  HGB 13.8 12.8*  HCT 39.8 37.6*  PLT 188 181   BMET  Recent Labs  07/04/14 1640 07/05/14 0305  NA  --  137  K  --  3.8  CL  --  98  CO2  --  29  GLUCOSE  --  137*  BUN  --  12  CREATININE 0.84 1.01  CALCIUM  --  8.8    COAG Lab Results  Component Value Date   INR 0.95 06/27/2014   No results found for: PTT    I have examined the patient, reviewed and agree with above.Doing well. Groins without hematoma. DC today.  Jalaine Riggenbach, MD 07/05/2014 8:00 AM

## 2014-07-06 NOTE — Discharge Summary (Signed)
[facility] Discharge Summary   Patient ID:  Andrew Estrada MRN: [id number] DOB/AGE: [birth year] 74 y.o.  Admit date: 07/04/2014 Discharge date: 07/06/2014 Date of Surgery: 07/04/2014 Surgeon: Surgeon(s): Rosetta Posner, MD Conrad Azle, MD  Admission Diagnosis: AAA  I71.4  Discharge Diagnoses:  AAA  I71.4  Secondary Diagnoses: Past Medical History  Diagnosis Date  . Hypertension   . DDD (degenerative disc disease), lumbar   . AAA (abdominal aortic aneurysm)   . Diabetes mellitus without complication     Diet controlled, no medications  . Chronic kidney disease     recent discovery of 'spot' on the left kidney  . History of hiatal hernia     takes meds infrequently-- tums  . Neuromuscular disorder     left hand shakes - ? lymes dis. yrs ago.Marland KitchenMarland KitchenMarland Estrada? [place] spotted fever    Procedure(s): ABDOMINAL AORTIC ENDOVASCULAR STENT GRAFT-GORE  Discharged Condition: good  HPI: The patient is a 74 y.o. ([birth year]) male who presents with chief complaint: follow up for AAA. Previous ultrasound study demonstrated an AAA, measuring 5.5 cm. The patient does not have back or abdominal pain. The patient is not a smoker. He was last seen on 05/12/14 where he was recommended to have a CTA for further evaluation. He also has been evaluated by his cardiologist Dr. Bettina Gavia in [facility] for pre-operative risk stratification and optimization.    Hospital Course:  Andrew Estrada is a 74 y.o. male is S/P  Procedure(s): ABDOMINAL AORTIC ENDOVASCULAR STENT GRAFT-GORE By Dr. Sherren Mocha Estrada and Dr. Adele Barthel. POD#1 He is tolerating PO's wel sitting up in bedside chair. He will ambulate with the nurses later and he needs to void independently. Discharge home later today.  F/U in 4 weeks with CTA  Consults:     Significant Diagnostic Studies: CBC Lab Results  Component Value Date   WBC 10.3 07/05/2014   HGB 12.8* 07/05/2014   HCT 37.6* 07/05/2014   MCV 92.2 07/05/2014   PLT 181  07/05/2014    BMET    Component Value Date/Time   NA 137 07/05/2014 0305   K 3.8 07/05/2014 0305   CL 98 07/05/2014 0305   CO2 29 07/05/2014 0305   GLUCOSE 137* 07/05/2014 0305   BUN 12 07/05/2014 0305   CREATININE 1.01 07/05/2014 0305   CALCIUM 8.8 07/05/2014 0305   GFRNONAA 71* 07/05/2014 0305   GFRAA 82* 07/05/2014 0305   COAG Lab Results  Component Value Date   INR 0.95 06/27/2014     Disposition:  Discharge to :Home Discharge Instructions    Call MD for:  redness, tenderness, or signs of infection (pain, swelling, bleeding, redness, odor or green/yellow discharge around incision site)    Complete by:  As directed      Call MD for:  severe or increased pain, loss or decreased feeling  in affected limb(s)    Complete by:  As directed      Call MD for:  temperature >100.5    Complete by:  As directed      Discharge instructions    Complete by:  As directed   You may shower 48 hours from surgery remove groin dressing and place Band-Aid after showering.     Driving Restrictions    Complete by:  As directed   No driving for 2 weeks     Increase activity slowly    Complete by:  As directed   Walk with assistance use walker or cane as needed  Lifting restrictions    Complete by:  As directed   No lifting for 6 weeks     Resume previous diet    Complete by:  As directed             Medication List    TAKE these medications        acetaminophen 500 MG tablet  Commonly known as:  TYLENOL  Take 500 mg by mouth every 6 (six) hours as needed for moderate pain.     cholecalciferol 1000 UNITS tablet  Commonly known as:  VITAMIN D  Take 3,000 Units by mouth daily.     ibuprofen 200 MG tablet  Commonly known as:  ADVIL,MOTRIN  Take 200 mg by mouth every 6 (six) hours as needed for moderate pain.     losartan 25 MG tablet  Commonly known as:  COZAAR  Take 25 mg by mouth daily.     traMADol 50 MG tablet  Commonly known as:  ULTRAM  Take 1 tablet (50 mg  total) by mouth every 6 (six) hours as needed for moderate pain.       Verbal and written Discharge instructions given to the patient. Wound care per Discharge AVS     Follow-up Information    Follow up with Estrada, TODD, MD In 4 weeks.   Specialty:  Vascular Surgery   Why:  sent message to office   Contact information:   [place] [address] [phone]       Signed: Laurence Slate [facility] 07/06/2014, 12:51 PM  - For VQI Registry use --- Instructions: Press F2 to tab through selections.  Delete question if not applicable.   Post-op:  Time to Extubation: [ x] In OR, [ ]  < 12 hrs, [ ]  12-24 hrs, [ ]  >=24 hrs Vasopressors Req. Post-op: No MI: [x ] No, [ ]  Troponin only, [ ]  EKG or Clinical New Arrhythmia: No CHF: No ICU Stay: 1 days Transfusion: No  If yes, 0 units given  Complications: Resp failure: [x ] none, [ ]  Pneumonia, [ ]  Ventilator Chg in renal function: [x ] none, [ ]  Inc. Cr > 0.5, [ ]  Temp. Dialysis, [ ]  Permanent dialysis Leg ischemia: [x ] No, [ ]  Yes, no Surgery needed, [ ]  Yes, Surgery needed, [ ]  Amputation Bowel ischemia: [x ] No, [ ]  Medical Rx, [ ]  Surgical Rx Wound complication: [x ] No, [ ]  Superficial separation/infection, [ ]  Return to OR Return to OR: No  Return to OR for bleeding: No Stroke: [x ] None, [ ]  Minor, [ ]  Major  Discharge medications: Statin use:  No  for medical reason   ASA use:  No  for medical reason   Plavix use:  No  for medical reason   Beta blocker use:  No  for medical reason

## 2014-07-14 ENCOUNTER — Telehealth: Payer: Self-pay | Admitting: Vascular Surgery

## 2014-07-14 NOTE — Telephone Encounter (Addendum)
-----   Message from Mena Goes, RN sent at 07/05/2014  9:37 AM EST ----- Regarding: Schedule   ----- Message -----    From: Ulyses Amor, PA-C    Sent: 07/05/2014   7:40 AM      To: Vvs Charge Pool  F/U in 4 weeks needs CTA AB/Pelvis in [place] a few days prior to visit with Dr. Donnetta Hutching.  S/P EVAR   07/14/14: Andrew Estrada is scheduled for his CTA at [facility] on 08/03/2014 @ 10:30am and to see TFE on 08/09/2014 @ 10/45am. He is aware and knows that he will need to bring a disk from CT with him to see TFE.

## 2014-07-15 ENCOUNTER — Telehealth: Payer: Self-pay

## 2014-07-15 NOTE — Telephone Encounter (Signed)
Attempted to call pt. To discuss his question. Left voice message to call back.

## 2014-07-15 NOTE — Telephone Encounter (Signed)
-----   Message from Gena Fray sent at 07/14/2014  3:29 PM EST ----- Regarding: FW: Schedule [name],  Please call Mr Andrew Estrada at his home #. He has a question about imaging his kidney at the same time as his EVAR follow up. Dr Donnetta Hutching had mentioned that he had a spot on his kidney.  Thank you so much! Hinton Dyer ----- Message -----    From: Mena Goes, RN    Sent: 07/05/2014   9:37 AM      To: [facility] Admin Pool Subject: Schedule                                         ----- Message -----    From: Ulyses Amor, PA-C    Sent: 07/05/2014   7:40 AM      To: Vvs Charge Pool  F/U in 4 weeks needs CTA AB/Pelvis in [place] a few days prior to visit with Dr. Donnetta Hutching.  S/P EVAR

## 2014-08-08 ENCOUNTER — Encounter: Payer: Self-pay | Admitting: Vascular Surgery

## 2014-08-09 ENCOUNTER — Ambulatory Visit (INDEPENDENT_AMBULATORY_CARE_PROVIDER_SITE_OTHER): Payer: Self-pay | Admitting: Vascular Surgery

## 2014-08-09 ENCOUNTER — Encounter: Payer: Self-pay | Admitting: Vascular Surgery

## 2014-08-09 VITALS — BP 138/82 | HR 84 | Resp 18 | Ht 70.0 in | Wt 217.0 lb

## 2014-08-09 DIAGNOSIS — Z48812 Encounter for surgical aftercare following surgery on the circulatory system: Secondary | ICD-10-CM

## 2014-08-09 DIAGNOSIS — I714 Abdominal aortic aneurysm, without rupture, unspecified: Secondary | ICD-10-CM

## 2014-08-09 NOTE — Progress Notes (Signed)
Presents today for follow-up of stent graft repair of abdominal aortic aneurysm on 07/04/2014. Interestingly he reports that he feels markedly better since his aneurysm repair. He reported that he had chronic back discomfort making it very difficult for him to lie flat. Explained it would be very difficult to relate his symptoms to his aneurysm and that hopefully will continue to have a durable improvement of this. He has had no trouble with his groin puncture sites or his abdomen.   On physical exam he has 2+ femoral and 2+ dorsalis pedis pulses. Puncture sites and both groins are well-healed with no evidence of false aneurysm. Abdomen is moderately obese. He has no tenderness I do not palpate an aneurysm   He underwent a CT scan for further follow-up of his aneurysm. This shows excellent positioning of the stent graft with no evidence of endoleak. He does have no change in his size with a maximal diameter 6 cm.   Impression and plan: Stable follow-up after stent graft repair of large abdominal aortic aneurysm on 07/04/2014. He will continue his usual activities. We will see him in 6 months with duplex follow-up of his stent graft repair.

## 2014-08-10 NOTE — Addendum Note (Signed)
Addended by: Mena Goes on: 08/10/2014 11:11 AM   Modules accepted: Orders

## 2014-08-11 ENCOUNTER — Encounter: Payer: Self-pay | Admitting: Vascular Surgery

## 2015-02-14 ENCOUNTER — Ambulatory Visit: Payer: Medicare Other | Admitting: Vascular Surgery

## 2015-02-14 ENCOUNTER — Other Ambulatory Visit (HOSPITAL_COMMUNITY): Payer: Medicare Other

## 2015-03-02 ENCOUNTER — Other Ambulatory Visit (HOSPITAL_COMMUNITY): Payer: Self-pay | Admitting: Urology

## 2015-03-02 DIAGNOSIS — N2889 Other specified disorders of kidney and ureter: Secondary | ICD-10-CM

## 2015-03-02 DIAGNOSIS — R935 Abnormal findings on diagnostic imaging of other abdominal regions, including retroperitoneum: Secondary | ICD-10-CM

## 2015-03-06 ENCOUNTER — Encounter: Payer: Self-pay | Admitting: Vascular Surgery

## 2015-03-06 ENCOUNTER — Other Ambulatory Visit (HOSPITAL_COMMUNITY): Payer: Medicare Other

## 2015-03-07 ENCOUNTER — Ambulatory Visit (INDEPENDENT_AMBULATORY_CARE_PROVIDER_SITE_OTHER): Payer: Medicare Other | Admitting: Vascular Surgery

## 2015-03-07 ENCOUNTER — Ambulatory Visit (HOSPITAL_COMMUNITY)
Admission: RE | Admit: 2015-03-07 | Discharge: 2015-03-07 | Disposition: A | Discharge status: Home / Self Care | Payer: Medicare Other | Source: Ambulatory Visit | Attending: Vascular Surgery | Admitting: Vascular Surgery

## 2015-03-07 ENCOUNTER — Encounter: Payer: Self-pay | Admitting: Vascular Surgery

## 2015-03-07 VITALS — BP 148/85 | HR 77 | Temp 97.8°F | Resp 16 | Ht 70.5 in | Wt 208.0 lb

## 2015-03-07 DIAGNOSIS — Z48812 Encounter for surgical aftercare following surgery on the circulatory system: Secondary | ICD-10-CM

## 2015-03-07 DIAGNOSIS — I714 Abdominal aortic aneurysm, without rupture, unspecified: Secondary | ICD-10-CM

## 2015-03-07 NOTE — Addendum Note (Signed)
Addended by: Dorthula Rue L on: 03/07/2015 11:17 AM   Modules accepted: Orders

## 2015-03-07 NOTE — Progress Notes (Signed)
Filed Vitals:   03/07/15 0900 03/07/15 0903  BP: 156/89 148/85  Pulse: 77 77  Temp: 97.8 F (36.6 C)   Resp: 16   Height: 5' 10.5" (1.791 m)   Weight: 208 lb (94.348 kg)   SpO2: 96%

## 2015-03-07 NOTE — Progress Notes (Signed)
Patient resents today for follow-up of stent graft repair of abdominal aortic aneurysm on 07/04/2014. He has no symptoms referable to his aneurysm. He does report a chronic pain in his right lower back extending around to his right subcostal region. Is having a GI workup for this. Also of note his wife passed away after about cancer last month after 59 years of marriage.  Past Medical History  Diagnosis Date  . Hypertension   . DDD (degenerative disc disease), lumbar   . AAA (abdominal aortic aneurysm)   . Diabetes mellitus without complication     Diet controlled, no medications  . Chronic kidney disease     recent discovery of 'spot' on the left kidney  . History of hiatal hernia     takes meds infrequently-- tums  . Neuromuscular disorder     left hand shakes - ? lymes dis. yrs ago.Marland KitchenMarland KitchenMarland Kitchen? [place] spotted fever    Social History  Substance Use Topics  . Smoking status: Never Smoker   . Smokeless tobacco: Never Used  . Alcohol Use: No    Family History  Problem Relation Age of Onset  . Heart disease Mother   . Cancer Father     Allergies  Allergen Reactions  . Celebrex [Celecoxib] Diarrhea  . Ciprofloxacin Diarrhea  . Codeine Diarrhea     Current outpatient prescriptions:  .  acetaminophen (TYLENOL) 500 MG tablet, Take 500 mg by mouth every 6 (six) hours as needed for moderate pain., Disp: , Rfl:  .  cholecalciferol (VITAMIN D) 1000 UNITS tablet, Take 3,000 Units by mouth daily. , Disp: , Rfl:  .  ibuprofen (ADVIL,MOTRIN) 200 MG tablet, Take 200 mg by mouth every 6 (six) hours as needed for moderate pain., Disp: , Rfl:  .  losartan (COZAAR) 25 MG tablet, Take 25 mg by mouth daily., Disp: , Rfl: 12 .  traMADol (ULTRAM) 50 MG tablet, Take 1 tablet (50 mg total) by mouth every 6 (six) hours as needed for moderate pain. (Patient not taking: Reported on 03/07/2015), Disp: 30 tablet, Rfl: 0  Filed Vitals:   03/07/15 0900 03/07/15 0903  BP: 156/89 148/85  Pulse: 77 77  Temp:  97.8 F (36.6 C)   Resp: 16   Height: 5' 10.5" (1.791 m)   Weight: 208 lb (94.348 kg)   SpO2: 96%     Body mass index is 29.41 kg/(m^2).       On physical exam his abdomen shows obesity. He does have femoral pulses and has 2-3+ posterior tibial pulses bilaterally. No evidence of popliteal artery aneurysm.  Duplex today which revealed with reviewed with the patient. This shows maximal diameter of 5.8 cm slightly lower than 5.9 pre-stent graft.  Impression and plan stable follow-up after stent graft repair of abdominal aortic aneurysm. We'll see him again in one year with continued ultrasound surveillance of his stent graft repair

## 2015-03-28 ENCOUNTER — Ambulatory Visit (HOSPITAL_COMMUNITY)
Admission: RE | Admit: 2015-03-28 | Discharge: 2015-03-28 | Disposition: A | Discharge status: Home / Self Care | Payer: Medicare Other | Source: Ambulatory Visit | Attending: Urology | Admitting: Urology

## 2015-03-28 DIAGNOSIS — N2889 Other specified disorders of kidney and ureter: Secondary | ICD-10-CM | POA: Diagnosis present

## 2015-03-28 DIAGNOSIS — Z9689 Presence of other specified functional implants: Secondary | ICD-10-CM | POA: Diagnosis not present

## 2015-03-28 DIAGNOSIS — R935 Abnormal findings on diagnostic imaging of other abdominal regions, including retroperitoneum: Secondary | ICD-10-CM | POA: Diagnosis present

## 2015-03-28 LAB — POCT I-STAT CREATININE: CREATININE: 1 mg/dL (ref 0.61–1.24)

## 2015-03-28 MED ORDER — GADOBENATE DIMEGLUMINE 529 MG/ML IV SOLN
20.0000 mL | Freq: Once | INTRAVENOUS | Status: AC | PRN
  Administered 2015-03-28: 20 mL via INTRAVENOUS

## 2015-07-19 ENCOUNTER — Encounter: Payer: Self-pay | Admitting: Cardiology

## 2015-08-29 ENCOUNTER — Other Ambulatory Visit (HOSPITAL_COMMUNITY): Payer: Self-pay | Admitting: Urology

## 2015-08-29 DIAGNOSIS — D4101 Neoplasm of uncertain behavior of right kidney: Secondary | ICD-10-CM

## 2015-08-29 DIAGNOSIS — D49511 Neoplasm of unspecified behavior of right kidney: Secondary | ICD-10-CM

## 2015-10-02 ENCOUNTER — Ambulatory Visit (HOSPITAL_COMMUNITY)
Admission: RE | Admit: 2015-10-02 | Discharge: 2015-10-02 | Disposition: A | Discharge status: Home / Self Care | Payer: Medicare Other | Source: Ambulatory Visit | Attending: Urology | Admitting: Urology

## 2015-10-02 ENCOUNTER — Other Ambulatory Visit (HOSPITAL_COMMUNITY): Payer: Self-pay | Admitting: Urology

## 2015-10-02 DIAGNOSIS — Z9889 Other specified postprocedural states: Secondary | ICD-10-CM | POA: Insufficient documentation

## 2015-10-02 DIAGNOSIS — N2889 Other specified disorders of kidney and ureter: Secondary | ICD-10-CM

## 2015-10-02 DIAGNOSIS — D49511 Neoplasm of unspecified behavior of right kidney: Secondary | ICD-10-CM | POA: Diagnosis present

## 2015-10-02 DIAGNOSIS — I700 Atherosclerosis of aorta: Secondary | ICD-10-CM | POA: Insufficient documentation

## 2015-10-02 DIAGNOSIS — D4101 Neoplasm of uncertain behavior of right kidney: Secondary | ICD-10-CM

## 2015-10-02 MED ORDER — GADOBENATE DIMEGLUMINE 529 MG/ML IV SOLN
19.0000 mL | Freq: Once | INTRAVENOUS | Status: AC | PRN
  Administered 2015-10-02: 19 mL via INTRAVENOUS

## 2016-03-06 ENCOUNTER — Encounter: Payer: Self-pay | Admitting: Family

## 2016-03-12 ENCOUNTER — Encounter: Payer: Self-pay | Admitting: Family

## 2016-03-12 ENCOUNTER — Ambulatory Visit (HOSPITAL_COMMUNITY)
Admission: RE | Admit: 2016-03-12 | Discharge: 2016-03-12 | Disposition: A | Discharge status: Home / Self Care | Payer: Medicare Other | Source: Ambulatory Visit | Attending: Vascular Surgery | Admitting: Vascular Surgery

## 2016-03-12 ENCOUNTER — Ambulatory Visit (INDEPENDENT_AMBULATORY_CARE_PROVIDER_SITE_OTHER): Payer: Medicare Other | Admitting: Family

## 2016-03-12 VITALS — BP 170/96 | HR 74 | Temp 98.7°F | Resp 16 | Ht 70.5 in | Wt 193.0 lb

## 2016-03-12 DIAGNOSIS — I714 Abdominal aortic aneurysm, without rupture, unspecified: Secondary | ICD-10-CM

## 2016-03-12 DIAGNOSIS — R140 Abdominal distension (gaseous): Secondary | ICD-10-CM | POA: Insufficient documentation

## 2016-03-12 DIAGNOSIS — Z95828 Presence of other vascular implants and grafts: Secondary | ICD-10-CM

## 2016-03-12 DIAGNOSIS — R0989 Other specified symptoms and signs involving the circulatory and respiratory systems: Secondary | ICD-10-CM

## 2016-03-12 NOTE — Progress Notes (Addendum)
[facility] [place]  CC: Follow up s/p EVAR  History of Present Illness  Andrew Estrada is a 75 y.o. ([birth year]) male patient of Dr. Donnetta Hutching returns today for follow-up of stent graft repair of abdominal aortic aneurysm on 07/04/2014. He has no symptoms referable to his aneurysm.  He does report a chronic pain in his right lower back extending around to his right subcostal region, had a GI workup for this.  He is seeing Dr. Lyndel Safe, GI, pt states Dr. Lyndel Safe is aware of bleeding in his stool.   Also of note his wife passed away from cancer in Sep 24, 2014 after 64 years of marriage. Dr. Donnetta Hutching last evaluated pt on 03/07/15. At that time duplex revealed a maximal diameter of 5.8 cm, slightly lower than 5.9 pre-stent graft. He reports lumbar spine surgery about September 24, 1994; states he has continued related lumbar spine issues.  He states he checked his blood pressure twice yesterday, states it was 140/70 recently.  He denies any history of stroke or TIA.   Pt Diabetic: Yes, diet controlled Pt smoker: non-smoker   Past Medical History:  Diagnosis Date  . AAA (abdominal aortic aneurysm) ([place])   . Chronic kidney disease    recent discovery of 'spot' on the left kidney  . DDD (degenerative disc disease), lumbar   . Diabetes mellitus without complication ([place])    Diet controlled, no medications  . History of hiatal hernia    takes meds infrequently-- tums  . Hypertension   . Neuromuscular disorder ([place])    left hand shakes - ? lymes dis. yrs ago.Marland KitchenMarland KitchenMarland Kitchen? [place] spotted fever   Past Surgical History:  Procedure Laterality Date  . ABDOMINAL AORTIC ENDOVASCULAR STENT GRAFT N/A 07/04/2014   Procedure: ABDOMINAL AORTIC ENDOVASCULAR STENT GRAFT-GORE;  Surgeon: Rosetta Posner, MD;  Location[facility] OR;  Service: Vascular;  Laterality: N/A;  . SPINE SURGERY     Laminectomy and failed disk syndrome    Social History Social History  Substance Use Topics  . Smoking status: Never Smoker  . Smokeless  tobacco: Never Used  . Alcohol use No   Family History Family History  Problem Relation Age of Onset  . Heart disease Mother   . Cancer Father    Current Outpatient Prescriptions on File Prior to Visit  Medication Sig Dispense Refill  . acetaminophen (TYLENOL) 500 MG tablet Take 500 mg by mouth every 6 (six) hours as needed for moderate pain.    . cholecalciferol (VITAMIN D) 1000 UNITS tablet Take 3,000 Units by mouth daily.     Marland Kitchen ibuprofen (ADVIL,MOTRIN) 200 MG tablet Take 200 mg by mouth every 6 (six) hours as needed for moderate pain.    Marland Kitchen losartan (COZAAR) 25 MG tablet Take 25 mg by mouth daily.  12  . traMADol (ULTRAM) 50 MG tablet Take 1 tablet (50 mg total) by mouth every 6 (six) hours as needed for moderate pain. 30 tablet 0   No current facility-administered medications on file prior to visit.    Allergies  Allergen Reactions  . Celebrex [Celecoxib] Diarrhea  . Ciprofloxacin Diarrhea  . Codeine Diarrhea     ROS: See HPI for pertinent positives and negatives.  Physical Examination  Vitals:   03/12/16 0909 03/12/16 0916  BP: (!) 161/92 (!) 170/96  Pulse: 74   Resp: 16   Temp: 98.7 F (37.1 C)   TempSrc: Oral   SpO2: 98%   Weight: 193 lb (87.5 kg)   Height: 5' 10.5" (1.791  m)    Body mass index is 27.3 kg/m.  General: A&O x 3, WD  Pulmonary: Sym exp, respirations are non labored, good air movt, CTAB, no rales, rhonchi, or wheezing.  Cardiac: RRR, Nl S1, S2, no murmur appreciated  Vascular: Vessel Right Left  Radial 2+Palpable 2+Palpable  Carotid  with bruit  without bruit  Aorta Not palpable N/A  Femoral 3+Palpable 3+Palpable  Popliteal Not palpable Not palpable  PT 1+Palpable 1+Palpable  DP Not Palpable Not Palpable   Gastrointestinal: soft, tender to palpation at midline, no palpable masses, - CVAT B.  Musculoskeletal: M/S 5/5 throughout, extremities without ischemic changes.  Neurologic: Pain and light touch intact in extremities, Motor exam  as listed above. CN 2-12 intact except is hard of hearing.  Non-Invasive Vascular Imaging  EVAR Duplex (Date: 03/12/16)  AAA sac size: 5.4 cm x 5.3 cm  no endoleak detected 03/07/15: 5.5 cm x 5.8 cm  Medical Decision Making  [name] is a 75 y.o. male who presents s/p EVAR (Date: 07/04/14).  Pt is asymptomatic with stable, slightly decreased sac size, no endoleak.   I discussed with the patient the importance of surveillance of the endograft.  The next endograft duplex will be scheduled for 12 months. Will also include bilateral carotid duplex for asymptomatic right carotid bruit.   The patient will follow up with [place] in 12 months with these studies.  I emphasized the importance of maximal medical management including strict control of blood pressure, blood glucose, and lipid levels, antiplatelet agents, obtaining regular exercise, and cessation of smoking.   Thank you for allowing [place] to participate in this patient's care.  Clemon Chambers, RN, MSN, FNP-C [facility] [place] Office: [phone]  Clinic Physician: Scot Dock on call  03/12/2016, 9:19 AM

## 2016-03-12 NOTE — Patient Instructions (Signed)
Before your next abdominal ultrasound:  Take two Extra-Strength Gas-X capsules at bedtime the night before the test. Take another two Extra-Strength Gas-X capsules 3 hours before the test.   

## 2016-04-27 IMAGING — MR MR ABDOMEN WO/W CM
9 of 18 series · 21 of 48 positions shown · IV contrast (multihance)
Comparison: MRI of the abdomen 03/28/2015.

CLINICAL DATA: 74-year-old male with history of right renal lesions
suspicious for potential papillary renal cell carcinoma. Followup
examination.

EXAM:
MRI ABDOMEN WITHOUT AND WITH CONTRAST
TECHNIQUE: Multiplanar multisequence MR imaging of the abdomen was performed
both before and after the administration of intravenous contrast.
CONTRAST:  19mL MULTIHANCE GADOBENATE DIMEGLUMINE 529 MG/ML IV SOLN

[Series 3: DWI b500 · axial · 6.0mm · 1.48mm/px · z∈[-24,+233]mm · 3 of 68 slices shown]
[im 1/68]
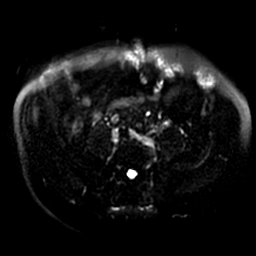
[im 34/68]
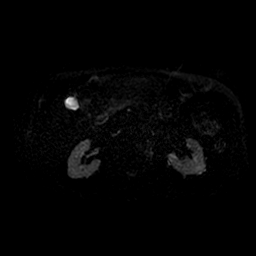
[im 68/68]
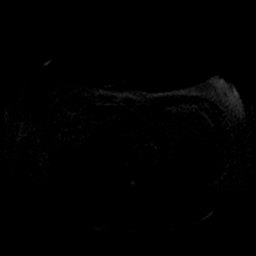

[Series 4: T2 fat-sat · axial · 5.0mm · 0.78mm/px · z∈[-33,+237]mm · 3 of 55 slices shown]
[im 1/55]
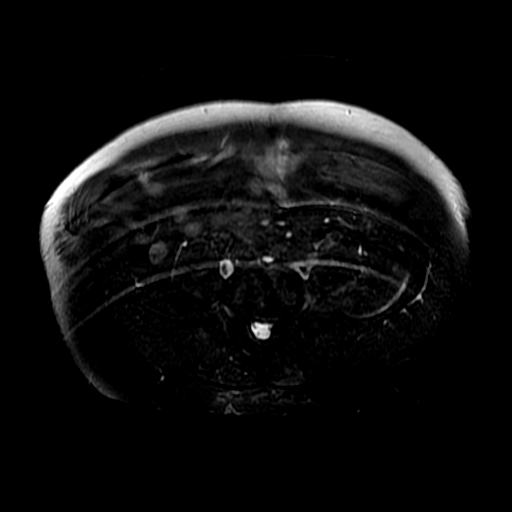
[im 28/55]
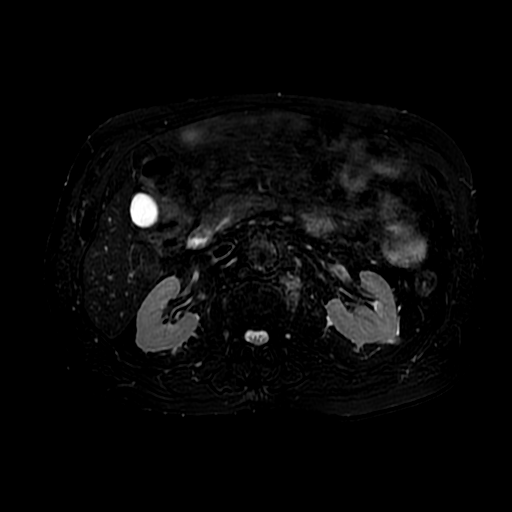
[im 55/55]
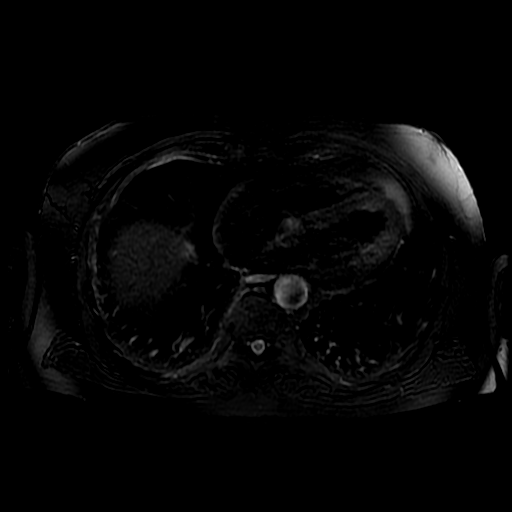

[Series 6: ax dualecho · axial · 5.0mm · 0.78mm/px · z∈[-9,+241]mm · 3 of 102 slices shown]
[im 1/102]
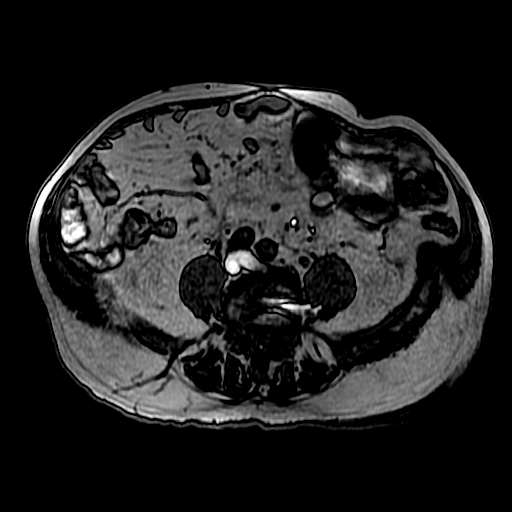
[im 51/102]
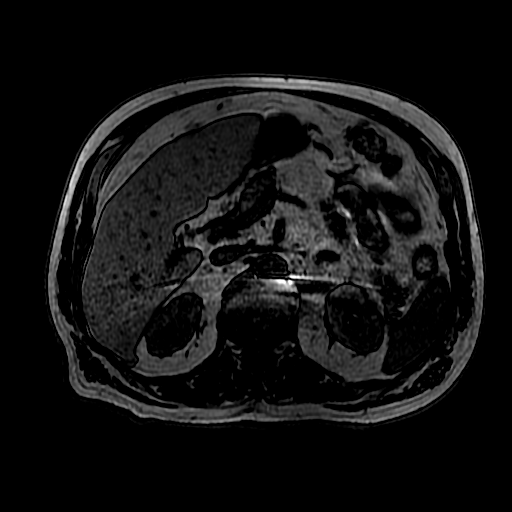
[im 102/102]
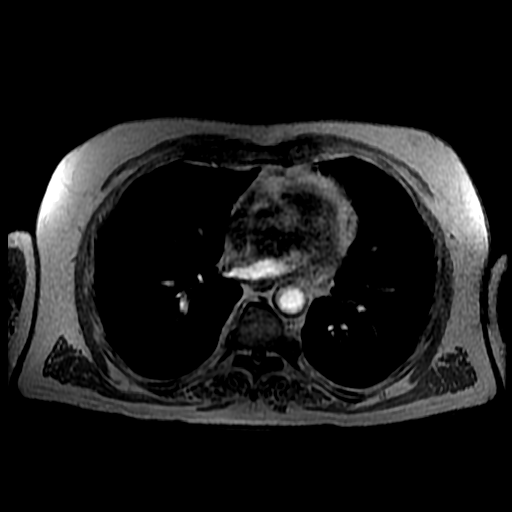

[Series 7: T2 · axial · 5.0mm · 0.78mm/px · z∈[-23,+252]mm · 2 of 56 slices shown (1 of 2)]
[im 1/56]
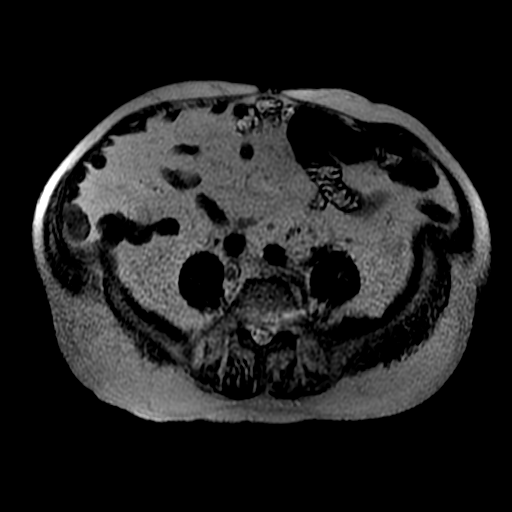
[im 56/56]
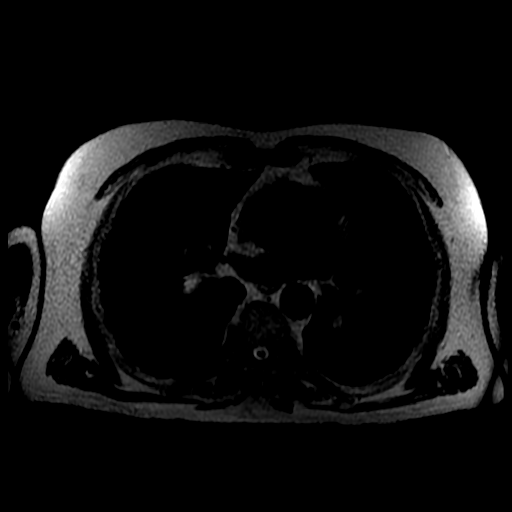

[Series 8: T2 · coronal · 5.0mm · 0.78mm/px · 1 of 40 slices shown (2 of 2)]
[im 1/40]
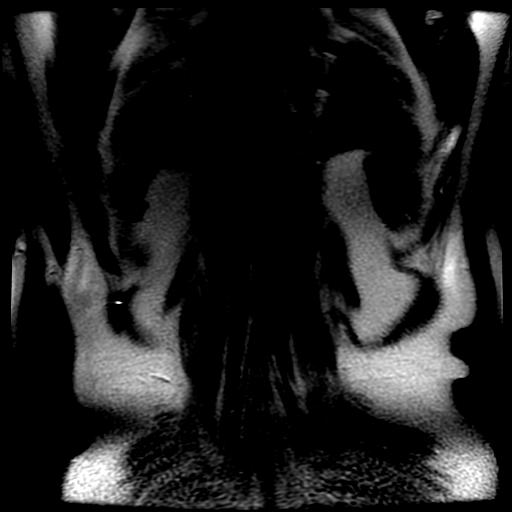

[Series 9: bSSFP · axial · 5.0mm · 0.78mm/px · z∈[-23,+252]mm · 2 of 56 slices shown]
[im 1/56]
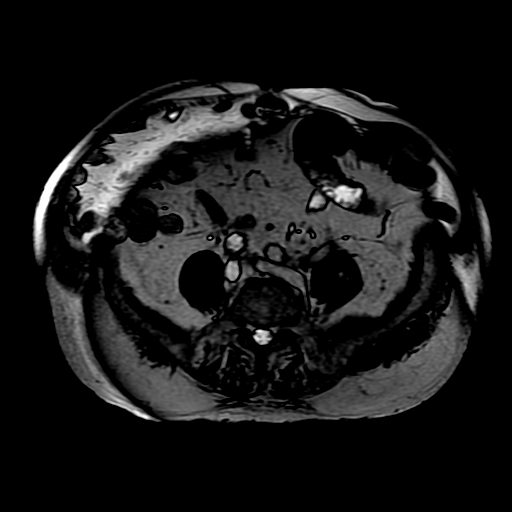
[im 56/56]
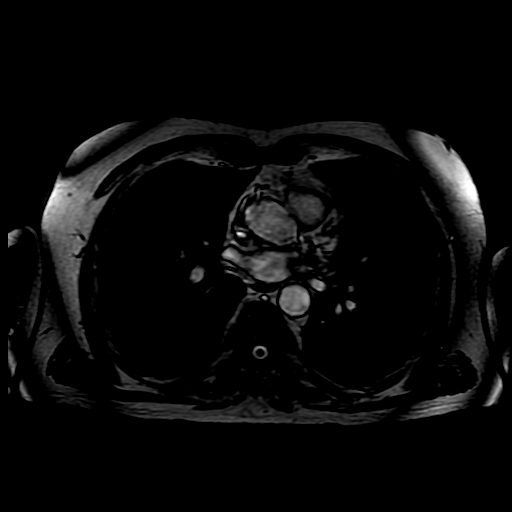

[Series 300: DWI · axial · 6.0mm · 1.48mm/px · 1 of 34 slices shown]
[im 1/34]
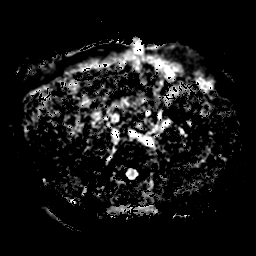

[Series 1000: T1 dynamic · axial · 5.0mm · 0.78mm/px · z∈[-10,+248]mm · 3 of 104 slices shown (1 of 2)]
[im 1/104]
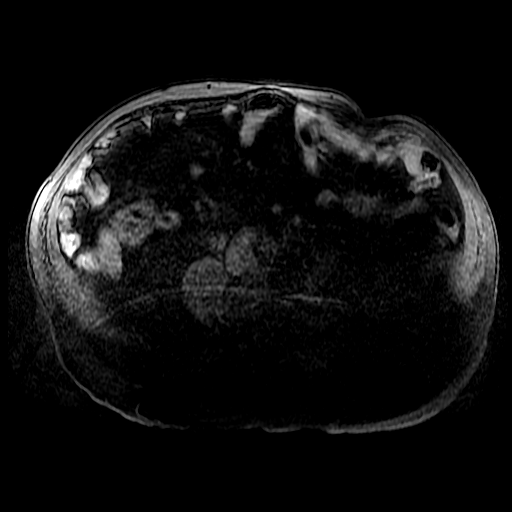
[im 52/104]
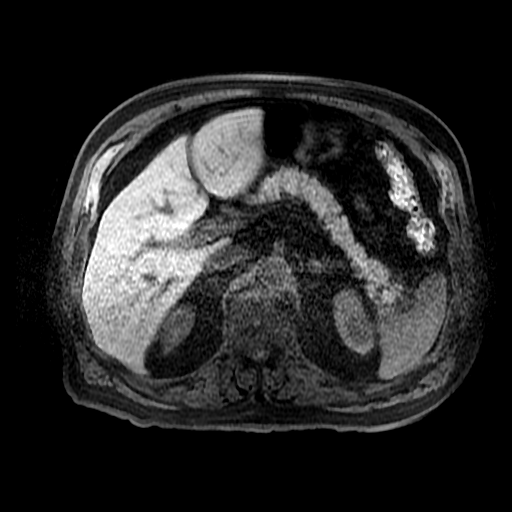
[im 104/104]
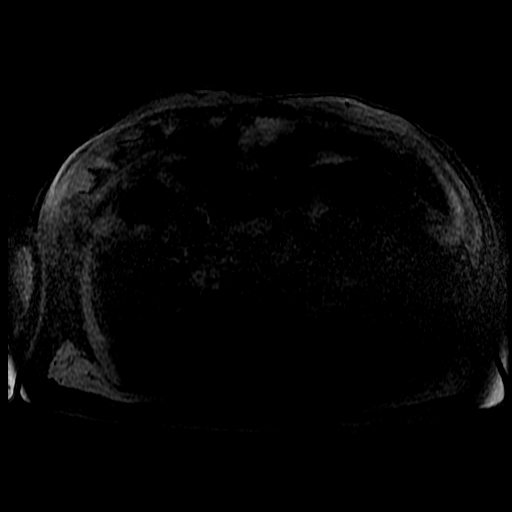

[Series 1001: T1 dynamic · axial · 5.0mm · 0.78mm/px · z∈[-10,+248]mm · 3 of 104 slices shown (2 of 2)]
[im 1/104]
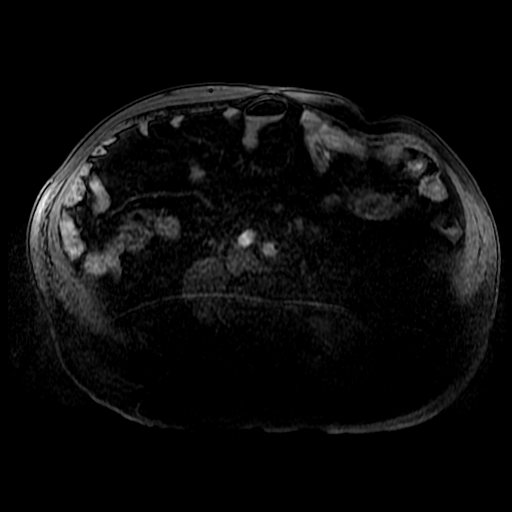
[im 52/104]
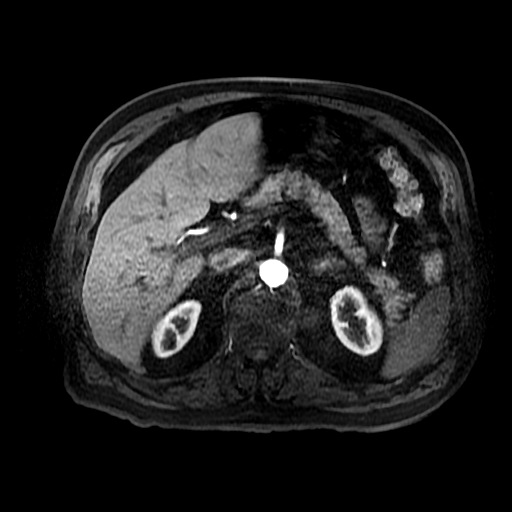
[im 104/104]
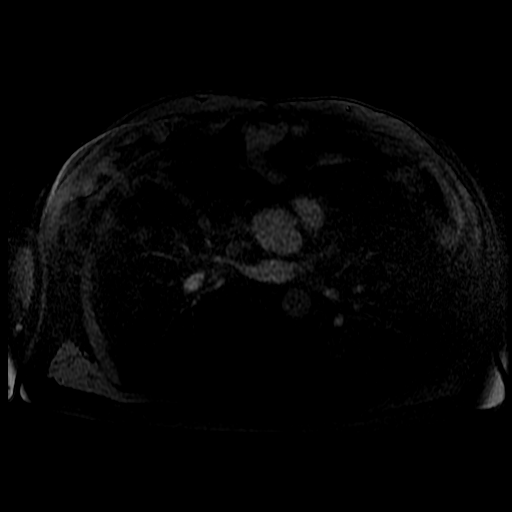

[21 of 48 positions shown; findings below may reference images not displayed]

FINDINGS: Lower chest:  Unremarkable.

Hepatobiliary: No cystic or solid hepatic lesions. No intra or
extrahepatic biliary ductal dilatation. Gallbladder is normal in
appearance.

Pancreas: No pancreatic mass. No pancreatic ductal dilatation. No
pancreatic or peripancreatic fluid or inflammatory changes.

Spleen: Unremarkable.

Adrenals/Urinary Tract: The lesion of concern on prior examinations
is stable in size measuring 12 mm (image 33 of series 4) in the
anterior aspect of the lower pole the right kidney, and again
demonstrates intermediate T1 signal intensity, high T2 signal
intensity, and very low level internal enhancement on post
gadolinium images, including a single thin internal septation. Other
right renal lesions are T1 hypointense, T2 hyperintense, and do not
enhance, compatible with simple cysts, largest of which is in the
lower pole measuring 16 mm. No hydroureteronephrosis in the
visualized abdomen. Bilateral adrenal glands are normal in
appearance.

Stomach/Bowel: The appearance of the stomach is normal. Visualized
portions of small bowel and colon are unremarkable.

Vascular/Lymphatic: Atherosclerosis, atherosclerosis, including
fusiform aneurysmal dilatation of the infrarenal abdominal aorta,
which measures up to 5.7 x 5.2 cm (decreased compared to the prior
examination). Post procedural changes of aorto bi-iliac endograft
are again noted. In the posterior aspect of the excluded aneurysm
sac there is an area of progressively increasing enhancement on
delayed post gadolinium images, compatible with probable type 2B
endoleak (via lumbar branches). No lymphadenopathy noted in the
abdomen.

Other: No significant volume of ascites in the visualized peritoneal
cavity.

Musculoskeletal: No aggressive appearing lesions are noted in the
visualized portions of the skeleton.
IMPRESSION: 1. The lesion of concern in the lower pole of the right kidney has
indeterminate imaging characteristics, but is stable in size. This
is currently characterized as a Bosniak class 2F lesion, and
warrants continued attention on followup studies annually until a
full 5 years of stability has been established.
2. Status post aorto bi-iliac endograft placement with persistent
evidence of type 2B endoleak.

## 2016-05-06 NOTE — Addendum Note (Signed)
Addended by: Lianne Cure A on: 05/06/2016 03:42 PM   Modules accepted: Orders

## 2016-07-11 DEATH — deceased

## 2017-03-18 ENCOUNTER — Ambulatory Visit: Payer: Medicare Other | Admitting: Family

## 2017-03-18 ENCOUNTER — Encounter (HOSPITAL_COMMUNITY): Payer: Medicare Other

## 2017-03-18 ENCOUNTER — Other Ambulatory Visit (HOSPITAL_COMMUNITY): Payer: Medicare Other
